# Patient Record
Sex: Female | Born: 1995
Health system: Southern US, Community
[De-identification: ages and names within clinical notes are randomized; demographics above are authoritative.]

## PROBLEM LIST (undated history)

## (undated) DIAGNOSIS — O139 Gestational [pregnancy-induced] hypertension without significant proteinuria, unspecified trimester: Secondary | ICD-10-CM

## (undated) HISTORY — DX: Gestational (pregnancy-induced) hypertension without significant proteinuria, unspecified trimester: O13.9

## (undated) HISTORY — PX: NO PAST SURGERIES: SHX2092

---

## 2017-05-18 DIAGNOSIS — O24419 Gestational diabetes mellitus in pregnancy, unspecified control: Secondary | ICD-10-CM

## 2017-05-18 HISTORY — DX: Gestational diabetes mellitus in pregnancy, unspecified control: O24.419

## 2017-12-02 DIAGNOSIS — O24419 Gestational diabetes mellitus in pregnancy, unspecified control: Secondary | ICD-10-CM

## 2019-02-21 ENCOUNTER — Emergency Department (HOSPITAL_COMMUNITY)
Admission: EM | Admit: 2019-02-21 | Discharge: 2019-02-21 | Disposition: A | Discharge status: Home / Self Care | Payer: Self-pay | Attending: Emergency Medicine | Admitting: Emergency Medicine

## 2019-02-21 ENCOUNTER — Other Ambulatory Visit: Payer: Self-pay

## 2019-02-21 ENCOUNTER — Encounter (HOSPITAL_COMMUNITY): Payer: Self-pay | Admitting: Emergency Medicine

## 2019-02-21 ENCOUNTER — Emergency Department (HOSPITAL_COMMUNITY): Payer: Self-pay

## 2019-02-21 DIAGNOSIS — Y9289 Other specified places as the place of occurrence of the external cause: Secondary | ICD-10-CM | POA: Insufficient documentation

## 2019-02-21 DIAGNOSIS — X501XXA Overexertion from prolonged static or awkward postures, initial encounter: Secondary | ICD-10-CM | POA: Insufficient documentation

## 2019-02-21 DIAGNOSIS — S93492A Sprain of other ligament of left ankle, initial encounter: Secondary | ICD-10-CM | POA: Insufficient documentation

## 2019-02-21 DIAGNOSIS — S93402A Sprain of unspecified ligament of left ankle, initial encounter: Secondary | ICD-10-CM

## 2019-02-21 DIAGNOSIS — Y9301 Activity, walking, marching and hiking: Secondary | ICD-10-CM | POA: Insufficient documentation

## 2019-02-21 DIAGNOSIS — Y999 Unspecified external cause status: Secondary | ICD-10-CM | POA: Insufficient documentation

## 2019-02-21 MED ORDER — IBUPROFEN 400 MG PO TABS
600.0000 mg | ORAL_TABLET | Freq: Once | ORAL | Status: AC
  Administered 2019-02-21: 600 mg via ORAL
  Filled 2019-02-21: qty 2

## 2019-02-21 NOTE — Discharge Instructions (Signed)
Your x-ray shows no evidence of fracture, you have an ankle sprain, wear ankle brace and use crutches, you can bear weight as tolerated as pain improves.  Ice and elevate the ankle.  Use 600 mg ibuprofen for pain, Tylenol as needed for additional pain.  If symptoms are not improving over the next week please follow-up with your PCP.

## 2019-02-21 NOTE — ED Triage Notes (Signed)
PT states she was walking outside and twisted her left ankle and fell onto her right side. PT c/o left ankle pain with ROM.

## 2019-02-21 NOTE — ED Provider Notes (Signed)
[facility] EMERGENCY DEPARTMENT Provider Note   CSN: [id number] Arrival date & time: 02/21/19  2041     History   Chief Complaint Chief Complaint  Patient presents with  . Ankle Injury    HPI Suzanne Mack is a 23 y.o. female.     Suzanne Mack is a 23 y.o. female who is otherwise healthy, presents to the ED for evaluation of left ankle pain.  She reports she was walking outside and twisted the left ankle falling onto her right side, no injuries from the fall.  Patient reports since then she has had pain over the top of the left foot without much swelling.  She has been ambulatory although pain is increased throughout the day while she has been walking on it.  No numbness tingling or weakness.  No bruising or discoloration.  Reports history of a similar ankle injury several years ago, but never required surgery.  No previous fractures.  Has not taken anything for pain prior to arrival, no other aggravating or alleviating factors.     History reviewed. No pertinent past medical history.  There are no active problems to display for this patient.   History reviewed. No pertinent surgical history.   OB History    Gravida  1   Para      Term      Preterm      AB      Living  1     SAB      TAB      Ectopic      Multiple      Live Births               Home Medications    Prior to Admission medications   Not on File    Family History History reviewed. No pertinent family history.  Social History Social History   Tobacco Use  . Smoking status: Never Smoker  . Smokeless tobacco: Never Used  Substance Use Topics  . Alcohol use: Never    Frequency: Never  . Drug use: Never     Allergies   Patient has no known allergies.   Review of Systems Review of Systems  Constitutional: Negative for chills and fever.  Musculoskeletal: Positive for arthralgias. Negative for joint swelling.  Skin: Negative for color change and wound.   Neurological: Negative for weakness and numbness.     Physical Exam Updated Vital Signs BP 138/89 (BP Location: Right Arm)   Pulse 100   Temp 98.7 F (37.1 C) (Oral)   Resp 18   Ht 5\' 3"  (1.6 m)   Wt 74.8 kg   LMP 01/22/2019   SpO2 97%   BMI 29.23 kg/m   Physical Exam Vitals signs and nursing note reviewed.  Constitutional:      General: She is not in acute distress.    Appearance: Normal appearance. She is well-developed and normal weight. She is not ill-appearing or diaphoretic.  HENT:     Head: Normocephalic and atraumatic.  Eyes:     General:        Right eye: No discharge.        Left eye: No discharge.  Pulmonary:     Effort: Pulmonary effort is normal. No respiratory distress.  Musculoskeletal:     Comments: Mild tenderness over the top of the left foot going towards the medial malleolus without appreciable swelling, no tenderness over the lateral malleolus, no tenderness over the calcaneus or Achilles tendon.  No ecchymosis  or color change.  2+ DP and PT pulses and good cap refill, normal sensation and strength.  No pain over the distal forefoot no pain at the knee.  Skin:    General: Skin is warm and dry.     Capillary Refill: Capillary refill takes less than 2 seconds.  Neurological:     Mental Status: She is alert and oriented to person, place, and time.     Coordination: Coordination normal.  Psychiatric:        Mood and Affect: Mood normal.        Behavior: Behavior normal.      ED Treatments / Results  Labs (all labs ordered are listed, but only abnormal results are displayed) Labs Reviewed - No data to display  EKG None  Radiology Dg Ankle Complete Left  Result Date: 02/21/2019 CLINICAL DATA:  Twisted left ankle with pain. EXAM: LEFT ANKLE COMPLETE - 3+ VIEW COMPARISON:  None. FINDINGS: There is no evidence of fracture, dislocation, or joint effusion. There is no evidence of arthropathy or other focal bone abnormality. Soft tissues are  unremarkable. IMPRESSION: Negative. Electronically Signed   By: Abelardo Diesel M.D.   On: 02/21/2019 21:38    Procedures Procedures (including critical care time)  Medications Ordered in ED Medications  ibuprofen (ADVIL) tablet 600 mg (has no administration in time range)     Initial Impression / Assessment and Plan / ED Course  I have reviewed the triage vital signs and the nursing notes.  Pertinent labs & imaging results that were available during my care of the patient were reviewed by me and considered in my medical decision making (see chart for details).  Presentation consistent with ankle sprain. Tenderness and swelling over top of the foot at the base of the ankle, no significant swelling, no tenderness over Achilles, pt is neurovascularly intact, and x-ray negative for fracture, and shows ankle mortise is intact. Pain treated in the ED. Pt placed in ASO brace and provided crutches, ambulated without difficulty. Pt stable for discharge home with ibuprofen for pain. Pt to follow-up with ortho in one week if symptoms not improving. Return precautions discussed, Pt expresses understanding and agrees with plan.   Final Clinical Impressions(s) / ED Diagnoses   Final diagnoses:  Sprain of left ankle, unspecified ligament, initial encounter    ED Discharge Orders    None       Janet Berlin 02/21/19 2219    Noemi Chapel, MD 02/24/19 [phone]

## 2019-05-30 ENCOUNTER — Other Ambulatory Visit: Payer: Self-pay

## 2019-05-30 ENCOUNTER — Ambulatory Visit: Payer: HRSA Program | Attending: Internal Medicine

## 2019-05-30 DIAGNOSIS — Z20822 Contact with and (suspected) exposure to covid-19: Secondary | ICD-10-CM

## 2019-06-01 ENCOUNTER — Telehealth: Payer: Self-pay | Admitting: *Deleted

## 2019-06-01 LAB — NOVEL CORONAVIRUS, NAA: SARS-CoV-2, NAA: NOT DETECTED

## 2019-06-01 NOTE — Telephone Encounter (Signed)
Patient calling for COVID result- notified still pending. Discussed MyChart- setup and activation code and help desk number given.

## 2021-04-17 ENCOUNTER — Encounter: Payer: Self-pay | Admitting: Obstetrics & Gynecology

## 2021-05-09 IMAGING — DX DG ANKLE COMPLETE 3+V*L*
3 series · 4 of 4 positions shown · non-contrast
Comparison: None.

CLINICAL DATA: Twisted left ankle with pain.

EXAM:
LEFT ANKLE COMPLETE - 3+ VIEW

[ankle ap]
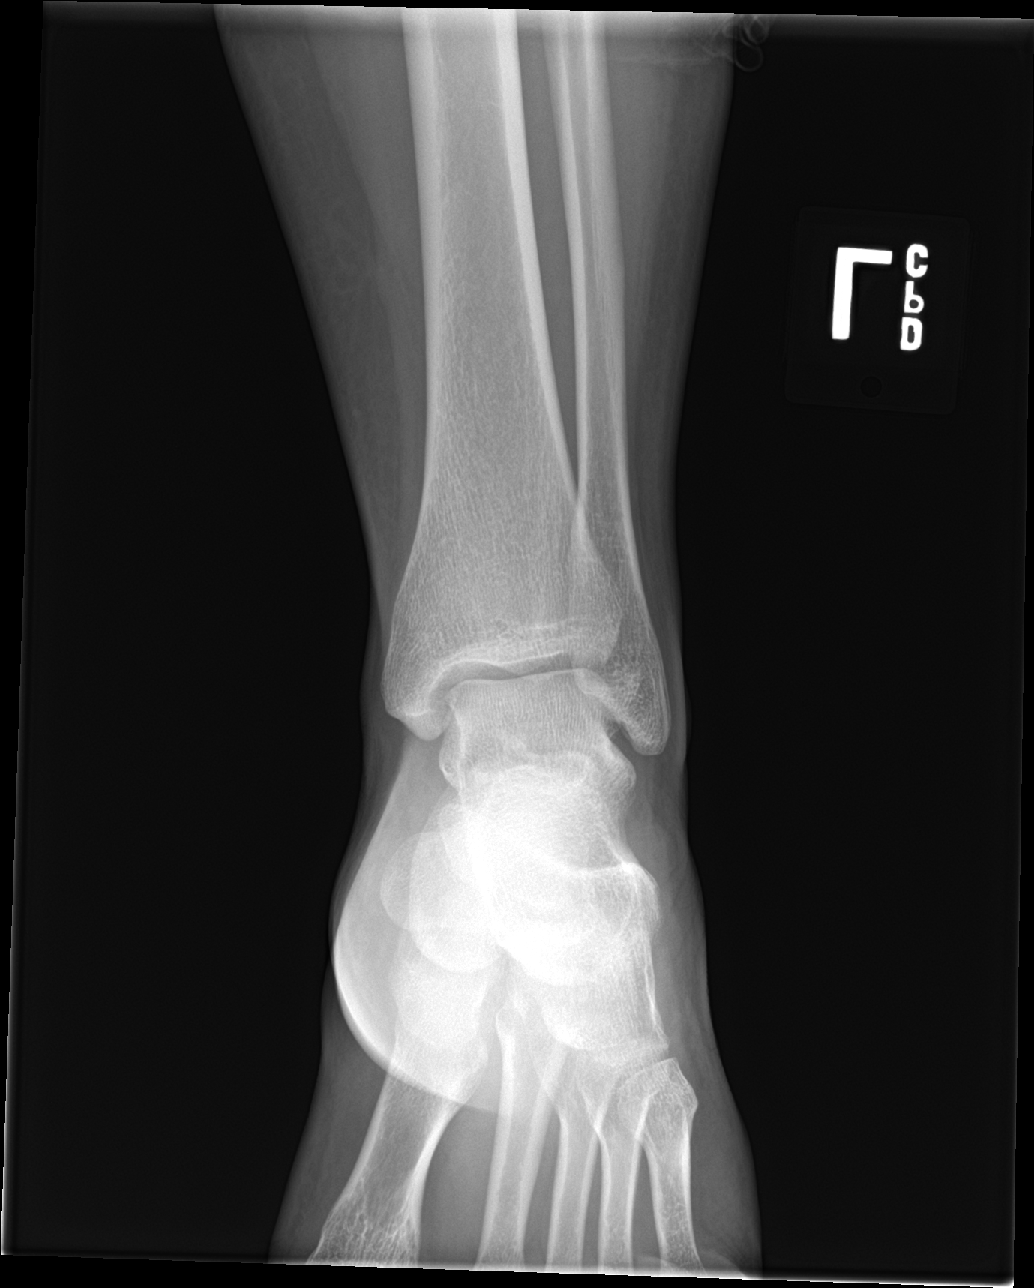

[Series 2: ankle obl · 0.14mm/px · 2 of 2 slices shown]
[im 1/2]
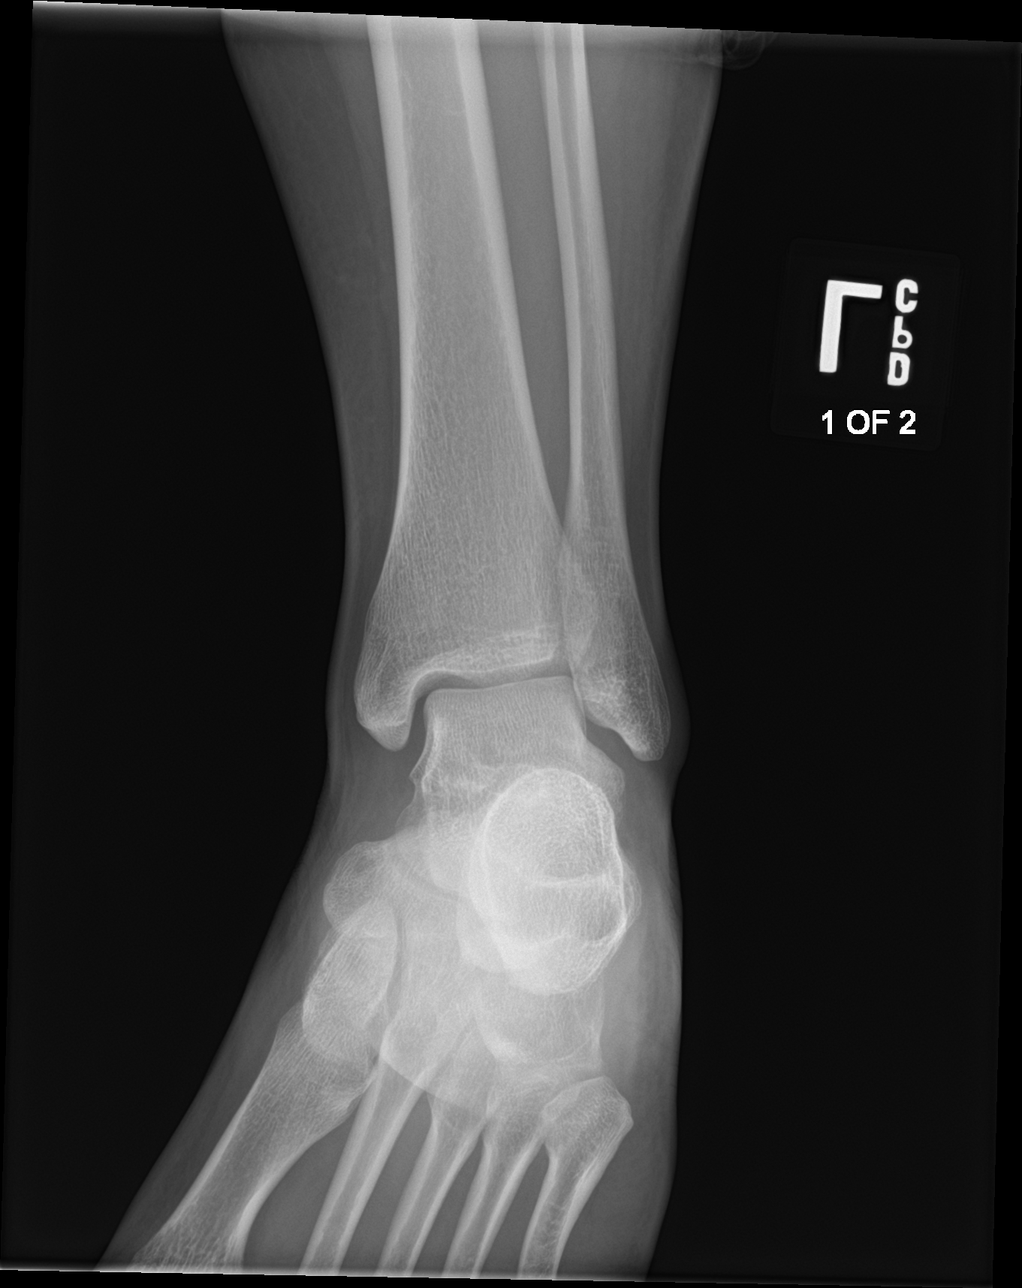
[im 2/2]
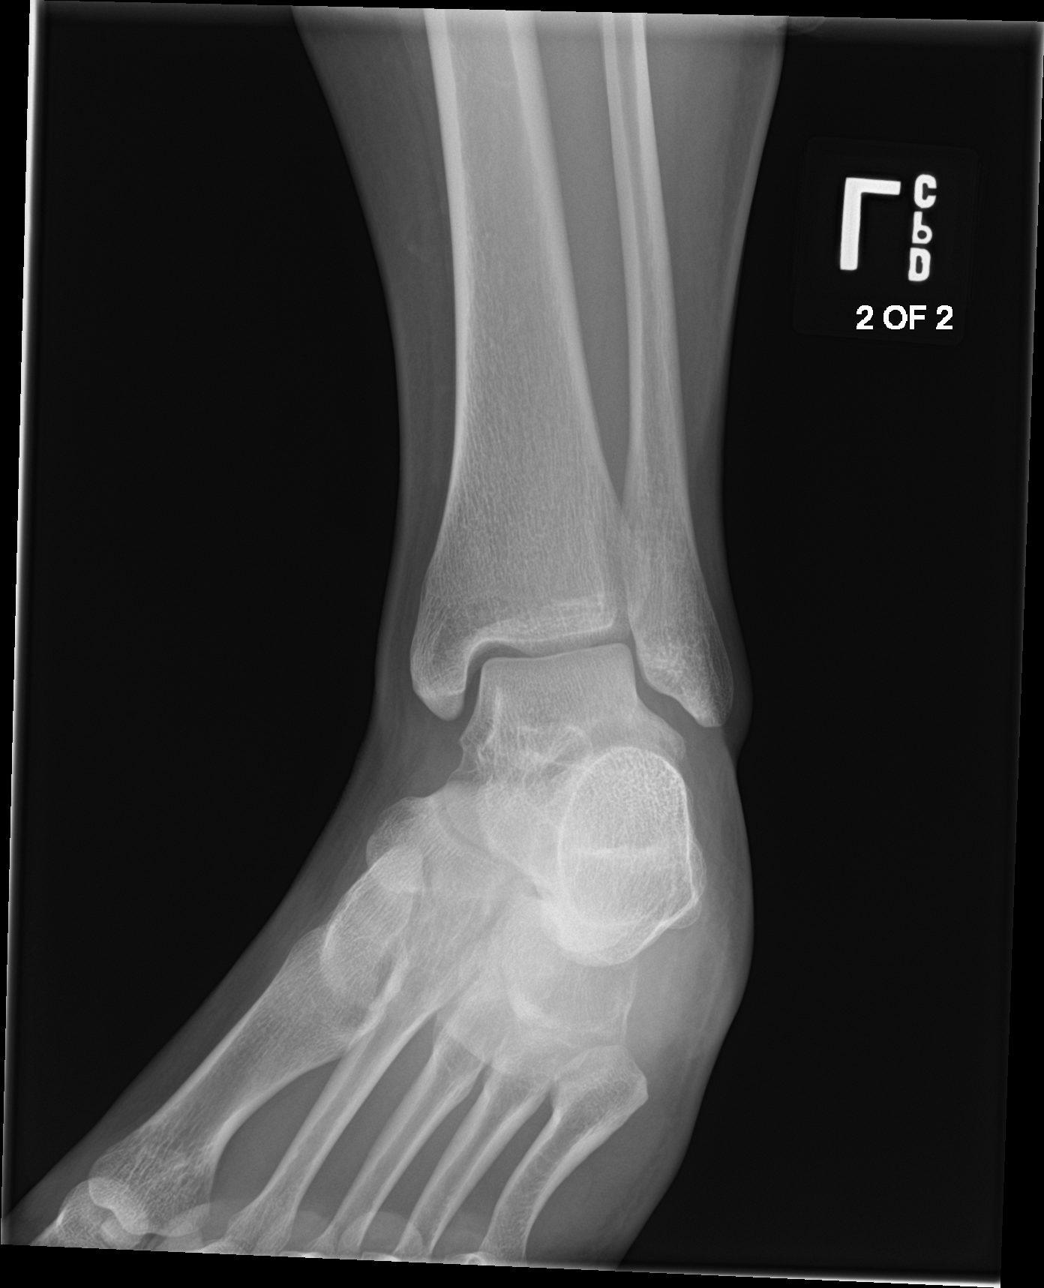

[ankle lat]
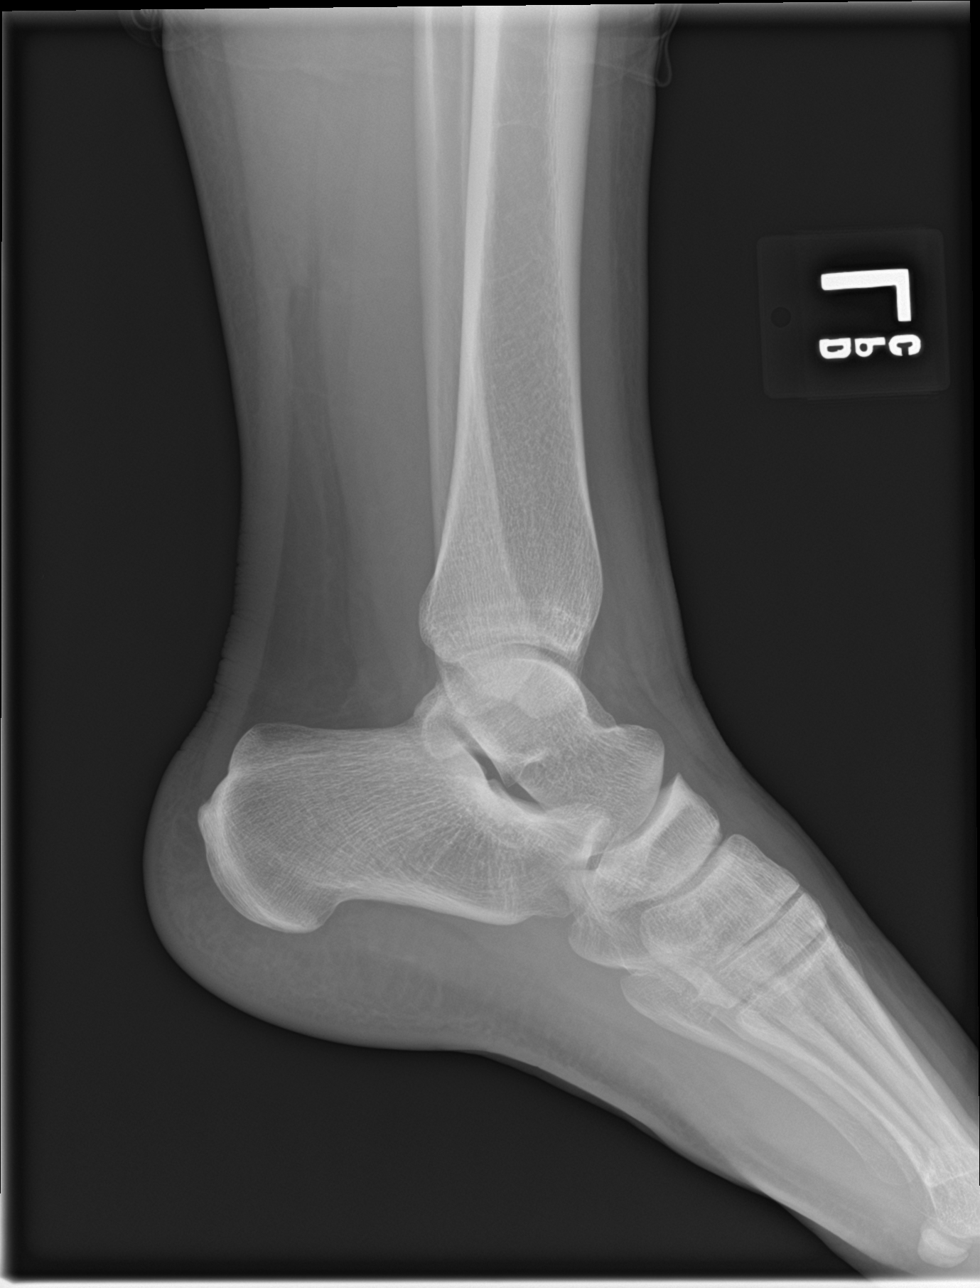

[4 of 4 positions shown; findings below may reference images not displayed]

FINDINGS: There is no evidence of fracture, dislocation, or joint effusion.
There is no evidence of arthropathy or other focal bone abnormality.
Soft tissues are unremarkable.
IMPRESSION: Negative.

## 2021-05-15 ENCOUNTER — Other Ambulatory Visit: Payer: Self-pay

## 2021-05-15 ENCOUNTER — Other Ambulatory Visit (HOSPITAL_COMMUNITY)
Admission: RE | Admit: 2021-05-15 | Discharge: 2021-05-15 | Disposition: A | Discharge status: Home / Self Care | Payer: Medicaid Other | Source: Ambulatory Visit | Attending: Obstetrics & Gynecology | Admitting: Obstetrics & Gynecology

## 2021-05-15 ENCOUNTER — Encounter: Payer: Self-pay | Admitting: Obstetrics & Gynecology

## 2021-05-15 ENCOUNTER — Ambulatory Visit (INDEPENDENT_AMBULATORY_CARE_PROVIDER_SITE_OTHER): Payer: Medicaid Other | Admitting: Obstetrics & Gynecology

## 2021-05-15 VITALS — BP 153/91 | HR 89 | Ht 63.0 in | Wt 171.6 lb

## 2021-05-15 DIAGNOSIS — Z01419 Encounter for gynecological examination (general) (routine) without abnormal findings: Secondary | ICD-10-CM | POA: Insufficient documentation

## 2021-05-15 DIAGNOSIS — R030 Elevated blood-pressure reading, without diagnosis of hypertension: Secondary | ICD-10-CM

## 2021-05-15 DIAGNOSIS — Z3009 Encounter for other general counseling and advice on contraception: Secondary | ICD-10-CM

## 2021-05-15 NOTE — Progress Notes (Signed)
WELL-WOMAN EXAMINATION Patient name: Suzanne Mack MRN [id number]  Date of birth: 07-25-1995 Chief Complaint:   New Patient (Initial Visit)  History of Present Illness:   Suzanne Mack is a 25 y.o. G64P1001  female being seen today for a routine well-woman exam.   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 Different partner then her prior pregnancy- current partner has 3 children of his own.  States they have sex at least daily.  -She is concerned that the infertility is because of her and states her periods are "all over the place." Not using an app, just tracking on her period.  She denies problems getting pregnant with her first pregnancy.  Menses are typically 7 days and start on the following days: Dec. 1-5 Nov. 1-8, Oct. 4-11 -Sept 9- -Aug 13 -July 15 -June 18  She denies h/o STI or PID.  Denies other medical problems.  Denies pelvic or abdominal pain.  No other acute concerns.  Patient's last menstrual period was 04/17/2021 (exact date).  The current method of family planning is none.    Last pap 2019?Marland Kitchen  Last mammogram: n/a. Last colonoscopy: n/a  Depression screen [facility] 2/9 05/15/2021  Decreased Interest 1  Down, Depressed, Hopeless 1  PHQ - 2 Score 2  Altered sleeping 0  Tired, decreased energy 1  Change in appetite 0  Feeling bad or failure about yourself  1  Trouble concentrating 0  Moving slowly or fidgety/restless 0  Suicidal thoughts 0  PHQ-9 Score 4    Review of Systems:   Pertinent items are noted in HPI Denies any headaches, blurred vision, fatigue, shortness of breath, chest pain, abdominal pain, bowel movements, urination, or intercourse unless otherwise stated above.  Pertinent History Reviewed:  Reviewed past medical,surgical, social and family history.  Reviewed problem list, medications and allergies. Physical Assessment:   Vitals:   05/15/21 1336 05/15/21 1338  BP: (!) 151/92 (!) 153/91  Pulse: 89 89  Weight:  171 lb 9.6 oz (77.8 kg)   Height: 5\' 3"  (1.6 m)   Body mass index is 30.4 kg/m.        Physical Examination:   General appearance - well appearing, and in no distress  Mental status - alert, oriented to person, place, and time  Psych:  She has a normal mood and affect  Skin - warm and dry, normal color, no suspicious lesions noted  Chest - effort normal, all lung fields clear to auscultation bilaterally  Heart - normal rate and regular rhythm  Neck:  midline trachea, no thyromegaly or nodules  Breasts - breasts appear normal, no suspicious masses, no skin or nipple changes or  axillary nodes  Abdomen - soft, nontender, nondistended, no masses or organomegaly  Pelvic - VULVA: normal appearing vulva with no masses, tenderness or lesions  VAGINA: normal appearing vagina with normal color and discharge, no lesions  CERVIX: normal appearing cervix without discharge or lesions, no CMT  Thin prep pap is done with HR HPV cotesting  UTERUS: uterus is felt to be normal size, shape, consistency and nontender   ADNEXA: No adnexal masses or tenderness noted.  Extremities:  No swelling or varicosities noted  Chaperone: & Plan:  1) Well-Woman Exam -pap collected reviewed screening guidelines  2) Family planning -reviewed "normal" cycle which can be every 21-36 days -encouraged regular tracking of cycle on ovulation/period tracker -discussed components of infertility work up- with current insurance- not likely to  be covered -declined work up at this time due to financial restraints  3) Elevated Blood pressure -pt plans to check BP at home -concern for white coat syndrome -f/u in 1 mos for BP check with RN   Meds: No orders of the defined types were placed in this encounter.   Follow-up: Return in about 4 weeks (around 06/12/2021) for RN visit- BP check.   Myna Hidalgo, DO Attending Obstetrician & Gynecologist, [facility] for [employer],  [facility]

## 2021-05-18 DIAGNOSIS — Z419 Encounter for procedure for purposes other than remedying health state, unspecified: Secondary | ICD-10-CM | POA: Diagnosis not present

## 2021-05-18 NOTE — L&D Delivery Note (Addendum)
Delivery Note  Obstetrician: Wyn Forster PGY3  Attending: Thressa Sheller CNM  Pre-Delivery Diagnosis: Term pregnancy or Induced labor  Post-Delivery Diagnosis: Living newborn infant female  Procedure: Induced vaginal delivery for GDMA1, s/p Cytotec(1x vaginal, 2x oral), SROM at delivery. Tight nuchal cord delivered through without incident. No lacerations. 1 minute of delayed cord clamping performed. Cord cut by Mom. Post partum pitocin infusion administered. Fundus firm.   Anesthesia: None   Episiotomy or Incision: none  Indications for instrumental delivery: none  Infant Wt: not yet weighed        Apgars: 1' 8  /  5' 9   Placenta and Cord:         Mechanism: spontaneous       Description:  complete  Estimated Blood Loss:  200 mL         Total IV Fluids: 2L LR         Specimens: cord blood collected, placenta to L&D for discard.         Complications:  none         Condition: stable  Blood Type and Rh: --/--/B POS (08/25 0110)  Rubella Immunity Status: Immune     Infant Feeding:  breast feeding  Attending Attestation: I was present with the resident for the entire delivery and I agree with the note above.   Thressa Sheller DNP, CNM  01/09/22  1:05 PM

## 2021-05-21 LAB — CYTOLOGY - PAP
Chlamydia: NEGATIVE
Comment: NEGATIVE
Comment: NEGATIVE
Comment: NORMAL
Diagnosis: NEGATIVE
High risk HPV: NEGATIVE
Neisseria Gonorrhea: NEGATIVE

## 2021-05-22 ENCOUNTER — Other Ambulatory Visit: Payer: Self-pay

## 2021-05-22 ENCOUNTER — Encounter: Payer: Self-pay | Admitting: Obstetrics & Gynecology

## 2021-05-22 ENCOUNTER — Ambulatory Visit (INDEPENDENT_AMBULATORY_CARE_PROVIDER_SITE_OTHER): Payer: Self-pay | Admitting: *Deleted

## 2021-05-22 DIAGNOSIS — Z3201 Encounter for pregnancy test, result positive: Secondary | ICD-10-CM

## 2021-05-22 LAB — POCT URINE PREGNANCY: Preg Test, Ur: POSITIVE — AB

## 2021-05-22 NOTE — Progress Notes (Signed)
Chart reviewed for nurse visit. Agree with plan of care.  Adline Potter, NP 05/22/2021 2:01 PM

## 2021-05-22 NOTE — Progress Notes (Signed)
° °  NURSE VISIT- PREGNANCY CONFIRMATION   SUBJECTIVE:  Suzanne Mack is a 26 y.o. G56P1001 female at [redacted]w[redacted]d by certain LMP of Patient's last menstrual period was 04/17/2021 (exact date). Here for pregnancy confirmation.  Home pregnancy test: positive x 3   She reports no complaints.  She is taking prenatal vitamins.    OBJECTIVE:  LMP 04/17/2021 (Exact Date)   Appears well, in no apparent distress  Results for orders placed or performed in visit on 05/22/21 (from the past 24 hour(s))  POCT urine pregnancy   Collection Time: 05/22/21  1:32 PM  Result Value Ref Range   Preg Test, Ur Positive (A) Negative    ASSESSMENT: Positive pregnancy test, [redacted]w[redacted]d by LMP    PLAN: Schedule for dating ultrasound in 3 weeks Prenatal vitamins: continue   Nausea medicines: not currently needed   OB packet given: Yes  Annamarie Dawley  05/22/2021 1:34 PM

## 2021-06-11 ENCOUNTER — Other Ambulatory Visit: Payer: Self-pay | Admitting: Obstetrics & Gynecology

## 2021-06-11 DIAGNOSIS — O3680X0 Pregnancy with inconclusive fetal viability, not applicable or unspecified: Secondary | ICD-10-CM

## 2021-06-12 ENCOUNTER — Ambulatory Visit (INDEPENDENT_AMBULATORY_CARE_PROVIDER_SITE_OTHER): Payer: Medicaid Other | Admitting: *Deleted

## 2021-06-12 ENCOUNTER — Ambulatory Visit (INDEPENDENT_AMBULATORY_CARE_PROVIDER_SITE_OTHER): Payer: Medicaid Other

## 2021-06-12 ENCOUNTER — Other Ambulatory Visit: Payer: Self-pay

## 2021-06-12 VITALS — BP 133/86 | HR 83

## 2021-06-12 DIAGNOSIS — O3680X0 Pregnancy with inconclusive fetal viability, not applicable or unspecified: Secondary | ICD-10-CM | POA: Diagnosis not present

## 2021-06-12 DIAGNOSIS — Z3A08 8 weeks gestation of pregnancy: Secondary | ICD-10-CM

## 2021-06-12 DIAGNOSIS — Z0130 Encounter for examination of blood pressure without abnormal findings: Secondary | ICD-10-CM

## 2021-06-12 NOTE — Progress Notes (Signed)
[place] 8 wks,single IUP with YS,FHR 150 bpm,small subchorionic hemorrhage 2.1 x .9 x .7 cm,CRL 16.18 mm,normal ovaries

## 2021-06-12 NOTE — Progress Notes (Signed)
° °  NURSE VISIT- BLOOD PRESSURE CHECK  SUBJECTIVE:  Suzanne Mack is a 26 y.o. G58P1001 female here for BP check. She is [redacted]w[redacted]d pregnant    HYPERTENSION ROS:  Pregnant/postpartum:  Severe headaches that don't go away with tylenol/other medicines: No  Visual changes (seeing spots/double/blurred vision) No  Severe pain under right breast breast or in center of upper chest No  Severe nausea/vomiting No  Taking medicines as instructed not applicable    OBJECTIVE:  BP 133/86 (BP Location: Right Arm, Patient Position: Sitting, Cuff Size: Normal)    Pulse 83    LMP 04/17/2021 (Exact Date)   Appearance alert, well appearing, and in no distress.  ASSESSMENT: Pregnancy [redacted]w[redacted]d  blood pressure check  PLAN: Routed to  Dr. Nelda Marseille   Recommendations: no changes needed   Follow-up: as scheduled   Suzanne Mack  06/12/2021 3:17 PM

## 2021-06-18 DIAGNOSIS — Z419 Encounter for procedure for purposes other than remedying health state, unspecified: Secondary | ICD-10-CM | POA: Diagnosis not present

## 2021-07-11 ENCOUNTER — Other Ambulatory Visit: Payer: Self-pay | Admitting: Obstetrics & Gynecology

## 2021-07-11 DIAGNOSIS — Z3682 Encounter for antenatal screening for nuchal translucency: Secondary | ICD-10-CM

## 2021-07-14 ENCOUNTER — Ambulatory Visit: Payer: Medicaid Other | Admitting: *Deleted

## 2021-07-14 ENCOUNTER — Encounter: Payer: Self-pay | Admitting: Women's Health

## 2021-07-14 ENCOUNTER — Ambulatory Visit (INDEPENDENT_AMBULATORY_CARE_PROVIDER_SITE_OTHER): Payer: Medicaid Other | Admitting: Women's Health

## 2021-07-14 ENCOUNTER — Other Ambulatory Visit: Payer: Self-pay

## 2021-07-14 ENCOUNTER — Ambulatory Visit (INDEPENDENT_AMBULATORY_CARE_PROVIDER_SITE_OTHER): Payer: Medicaid Other

## 2021-07-14 VITALS — BP 124/81 | HR 101 | Wt 172.2 lb

## 2021-07-14 DIAGNOSIS — Z3480 Encounter for supervision of other normal pregnancy, unspecified trimester: Secondary | ICD-10-CM

## 2021-07-14 DIAGNOSIS — Z3A12 12 weeks gestation of pregnancy: Secondary | ICD-10-CM

## 2021-07-14 DIAGNOSIS — Z8759 Personal history of other complications of pregnancy, childbirth and the puerperium: Secondary | ICD-10-CM

## 2021-07-14 DIAGNOSIS — Z8659 Personal history of other mental and behavioral disorders: Secondary | ICD-10-CM | POA: Diagnosis not present

## 2021-07-14 DIAGNOSIS — Z3490 Encounter for supervision of normal pregnancy, unspecified, unspecified trimester: Secondary | ICD-10-CM | POA: Insufficient documentation

## 2021-07-14 DIAGNOSIS — O0990 Supervision of high risk pregnancy, unspecified, unspecified trimester: Secondary | ICD-10-CM | POA: Insufficient documentation

## 2021-07-14 DIAGNOSIS — Z3481 Encounter for supervision of other normal pregnancy, first trimester: Secondary | ICD-10-CM

## 2021-07-14 DIAGNOSIS — Z3682 Encounter for antenatal screening for nuchal translucency: Secondary | ICD-10-CM

## 2021-07-14 DIAGNOSIS — Z8632 Personal history of gestational diabetes: Secondary | ICD-10-CM | POA: Diagnosis not present

## 2021-07-14 DIAGNOSIS — Z1379 Encounter for other screening for genetic and chromosomal anomalies: Secondary | ICD-10-CM | POA: Diagnosis not present

## 2021-07-14 MED ORDER — BLOOD PRESSURE MONITOR MISC: 0 refills | Status: DC

## 2021-07-14 MED ORDER — DOXYLAMINE-PYRIDOXINE 10-10 MG PO TBEC: DELAYED_RELEASE_TABLET | ORAL | 6 refills | Status: DC

## 2021-07-14 NOTE — Patient Instructions (Signed)
Lourie, thank you for choosing our office today! We appreciate the opportunity to meet your healthcare needs. You may receive a short survey by mail, e-mail, or through MyChart. If you are happy with your care we would appreciate if you could take just a few minutes to complete the survey questions. We read all of your comments and take your feedback very seriously. Thank you again for choosing our office.  [facility] ([address] [place], [place] [address]) Entrance C, located off of [address] Free 24/7 valet parking   Nausea & Vomiting Have saltine crackers or pretzels by your bed and eat a few bites before you raise your head out of bed in the morning Eat small frequent meals throughout the day instead of large meals Drink plenty of fluids throughout the day to stay hydrated, just don't drink a lot of fluids with your meals.  This can make your stomach fill up faster making you feel sick Do not brush your teeth right after you eat Products with real ginger are good for nausea, like ginger ale and ginger hard candy Make sure it says made with real ginger! Sucking on sour candy like lemon heads is also good for nausea If your prenatal vitamins make you nauseated, take them at night so you will sleep through the nausea Sea Bands If you feel like you need medicine for the nausea & vomiting please let us know If you are unable to keep any fluids or food down please let us know   Constipation Drink plenty of fluid, preferably water, throughout the day Eat foods high in fiber such as fruits, vegetables, and grains Exercise, such as walking, is a good way to keep your bowels regular Drink warm fluids, especially warm prune juice, or decaf coffee Eat a 1/2 cup of real oatmeal (not instant), 1/2 cup applesauce, and 1/2-1 cup warm prune juice every day If needed, you may take Colace (docusate sodium) stool  softener once or twice a day to help keep the stool soft.  If you still are having problems with constipation, you may take Miralax once daily as needed to help keep your bowels regular.   Home Blood Pressure Monitoring for Patients   Your provider has recommended that you check your blood pressure (BP) at least once a week at home. If you do not have a blood pressure cuff at home, one will be provided for you. Contact your provider if you have not received your monitor within 1 week.   Helpful Tips for Accurate Home Blood Pressure Checks  Don't smoke, exercise, or drink caffeine 30 minutes before checking your BP Use the restroom before checking your BP (a full bladder can raise your pressure) Relax in a comfortable upright chair Feet on the ground Left arm resting comfortably on a flat surface at the level of your heart Legs uncrossed Back supported Sit quietly and don't talk Place the cuff on your bare arm Adjust snuggly, so that only two fingertips can fit between your skin and the top of the cuff Check 2 readings separated by at least one minute Keep a log of your BP readings For a visual, please reference this diagram: http://ccnc.care/bpdiagram  Provider Name: [facility]     Phone: [phone]  Zone 1: ALL CLEAR  Continue to monitor your symptoms:  BP reading is less than 140 (top number) or less than 90 (bottom   number)  No right upper stomach pain No headaches or seeing spots No feeling nauseated or throwing up No swelling in face and hands  Zone 2: CAUTION Call your doctor's office for any of the following:  BP reading is greater than 140 (top number) or greater than 90 (bottom number)  Stomach pain under your ribs in the middle or right side Headaches or seeing spots Feeling nauseated or throwing up Swelling in face and hands  Zone 3: EMERGENCY  Seek immediate medical care if you have any of the following:  BP reading is greater than160 (top number) or  greater than 110 (bottom number) Severe headaches not improving with Tylenol Serious difficulty catching your breath Any worsening symptoms from Zone 2    First Trimester of Pregnancy The first trimester of pregnancy is from week 1 until the end of week 12 (months 1 through 3). A week after a sperm fertilizes an egg, the egg will implant on the wall of the uterus. This embryo will begin to develop into a baby. Genes from you and your partner are forming the baby. The female genes determine whether the baby is a boy or a girl. At 6-8 weeks, the eyes and face are formed, and the heartbeat can be seen on ultrasound. At the end of 12 weeks, all the baby's organs are formed.  Now that you are pregnant, you will want to do everything you can to have a healthy baby. Two of the most important things are to get good prenatal care and to follow your health care provider's instructions. Prenatal care is all the medical care you receive before the baby's birth. This care will help prevent, find, and treat any problems during the pregnancy and childbirth. BODY CHANGES Your body goes through many changes during pregnancy. The changes vary from woman to woman.  You may gain or lose a couple of pounds at first. You may feel sick to your stomach (nauseous) and throw up (vomit). If the vomiting is uncontrollable, call your health care provider. You may tire easily. You may develop headaches that can be relieved by medicines approved by your health care provider. You may urinate more often. Painful urination may mean you have a bladder infection. You may develop heartburn as a result of your pregnancy. You may develop constipation because certain hormones are causing the muscles that push waste through your intestines to slow down. You may develop hemorrhoids or swollen, bulging veins (varicose veins). Your breasts may begin to grow larger and become tender. Your nipples may stick out more, and the tissue that  surrounds them (areola) may become darker. Your gums may bleed and may be sensitive to brushing and flossing. Dark spots or blotches (chloasma, mask of pregnancy) may develop on your face. This will likely fade after the baby is born. Your menstrual periods will stop. You may have a loss of appetite. You may develop cravings for certain kinds of food. You may have changes in your emotions from day to day, such as being excited to be pregnant or being concerned that something may go wrong with the pregnancy and baby. You may have more vivid and strange dreams. You may have changes in your hair. These can include thickening of your hair, rapid growth, and changes in texture. Some women also have hair loss during or after pregnancy, or hair that feels dry or thin. Your hair will most likely return to normal after your baby is born. WHAT TO EXPECT AT YOUR PRENATAL   VISITS During a routine prenatal visit: You will be weighed to make sure you and the baby are growing normally. Your blood pressure will be taken. Your abdomen will be measured to track your baby's growth. The fetal heartbeat will be listened to starting around week 10 or 12 of your pregnancy. Test results from any previous visits will be discussed. Your health care provider may ask you: How you are feeling. If you are feeling the baby move. If you have had any abnormal symptoms, such as leaking fluid, bleeding, severe headaches, or abdominal cramping. If you have any questions. Other tests that may be performed during your first trimester include: Blood tests to find your blood type and to check for the presence of any previous infections. They will also be used to check for low iron levels (anemia) and Rh antibodies. Later in the pregnancy, blood tests for diabetes will be done along with other tests if problems develop. Urine tests to check for infections, diabetes, or protein in the urine. An ultrasound to confirm the proper growth  and development of the baby. An amniocentesis to check for possible genetic problems. Fetal screens for spina bifida and Down syndrome. You may need other tests to make sure you and the baby are doing well. HOME CARE INSTRUCTIONS  Medicines Follow your health care provider's instructions regarding medicine use. Specific medicines may be either safe or unsafe to take during pregnancy. Take your prenatal vitamins as directed. If you develop constipation, try taking a stool softener if your health care provider approves. Diet Eat regular, well-balanced meals. Choose a variety of foods, such as meat or vegetable-based protein, fish, milk and low-fat dairy products, vegetables, fruits, and whole grain breads and cereals. Your health care provider will help you determine the amount of weight gain that is right for you. Avoid raw meat and uncooked cheese. These carry germs that can cause birth defects in the baby. Eating four or five small meals rather than three large meals a day may help relieve nausea and vomiting. If you start to feel nauseous, eating a few soda crackers can be helpful. Drinking liquids between meals instead of during meals also seems to help nausea and vomiting. If you develop constipation, eat more high-fiber foods, such as fresh vegetables or fruit and whole grains. Drink enough fluids to keep your urine clear or pale yellow. Activity and Exercise Exercise only as directed by your health care provider. Exercising will help you: Control your weight. Stay in shape. Be prepared for labor and delivery. Experiencing pain or cramping in the lower abdomen or low back is a good sign that you should stop exercising. Check with your health care provider before continuing normal exercises. Try to avoid standing for long periods of time. Move your legs often if you must stand in one place for a long time. Avoid heavy lifting. Wear low-heeled shoes, and practice good posture. You may  continue to have sex unless your health care provider directs you otherwise. Relief of Pain or Discomfort Wear a good support bra for breast tenderness.   Take warm sitz baths to soothe any pain or discomfort caused by hemorrhoids. Use hemorrhoid cream if your health care provider approves.   Rest with your legs elevated if you have leg cramps or low back pain. If you develop varicose veins in your legs, wear support hose. Elevate your feet for 15 minutes, 3-4 times a day. Limit salt in your diet. Prenatal Care Schedule your prenatal visits by the   twelfth week of pregnancy. They are usually scheduled monthly at first, then more often in the last 2 months before delivery. Write down your questions. Take them to your prenatal visits. Keep all your prenatal visits as directed by your health care provider. Safety Wear your seat belt at all times when driving. Make a list of emergency phone numbers, including numbers for family, friends, the hospital, and police and fire departments. General Tips Ask your health care provider for a referral to a local prenatal education class. Begin classes no later than at the beginning of month 6 of your pregnancy. Ask for help if you have counseling or nutritional needs during pregnancy. Your health care provider can offer advice or refer you to specialists for help with various needs. Do not use hot tubs, steam rooms, or saunas. Do not douche or use tampons or scented sanitary pads. Do not cross your legs for long periods of time. Avoid cat litter boxes and soil used by cats. These carry germs that can cause birth defects in the baby and possibly loss of the fetus by miscarriage or stillbirth. Avoid all smoking, herbs, alcohol, and medicines not prescribed by your health care provider. Chemicals in these affect the formation and growth of the baby. Schedule a dentist appointment. At home, brush your teeth with a soft toothbrush and be gentle when you floss. SEEK  MEDICAL CARE IF:  You have dizziness. You have mild pelvic cramps, pelvic pressure, or nagging pain in the abdominal area. You have persistent nausea, vomiting, or diarrhea. You have a bad smelling vaginal discharge. You have pain with urination. You notice increased swelling in your face, hands, legs, or ankles. SEEK IMMEDIATE MEDICAL CARE IF:  You have a fever. You are leaking fluid from your vagina. You have spotting or bleeding from your vagina. You have severe abdominal cramping or pain. You have rapid weight gain or loss. You vomit blood or material that looks like coffee grounds. You are exposed to German measles and have never had them. You are exposed to fifth disease or chickenpox. You develop a severe headache. You have shortness of breath. You have any kind of trauma, such as from a fall or a car accident. Document Released: 04/28/2001 Document Revised: 09/18/2013 Document Reviewed: 03/14/2013 ExitCare Patient Information 2015 [employer]. This information is not intended to replace advice given to you by your health care provider. Make sure you discuss any questions you have with your health care provider.  

## 2021-07-14 NOTE — Progress Notes (Signed)
[place] 12+4 wks,measurements c/w dates,CRL 64.86,NB present,NT 1.4 mm,FHR 137 bpm,normal ovaries

## 2021-07-14 NOTE — Progress Notes (Signed)
INITIAL OBSTETRICAL VISIT Patient name: Suzanne Mack MRN [id number]  Date of birth: [birth year] Chief Complaint:   Initial Prenatal Visit and Pregnancy Ultrasound  History of Present Illness:   Suzanne Mack is a 26 y.o. G37P1001 Caucasian female at [redacted]w[redacted]d by LMP c/w u/s at 8 weeks with an Estimated Date of Delivery: 01/22/22 being seen today for her initial obstetrical visit.   Patient's last menstrual period was 04/17/2021. Her obstetrical history is significant for  term SVB x 1 in [facility]. Had A1DM during the pregnancy .   Today she reports N/V, requests rx Last pap 05/15/21. Results were: NILM w/ HRHPV negative  Depression screen [facility] 2/9 07/14/2021 05/15/2021  Decreased Interest 2 1  Down, Depressed, Hopeless 0 1  PHQ - 2 Score 2 2  Altered sleeping 0 0  Tired, decreased energy 1 1  Change in appetite 0 0  Feeling bad or failure about yourself  0 1  Trouble concentrating 0 0  Moving slowly or fidgety/restless 0 0  Suicidal thoughts 0 0  PHQ-9 Score 3 4     GAD 7 : Generalized Anxiety Score 07/14/2021 05/15/2021  Nervous, Anxious, on Edge 1 0  Control/stop worrying 0 0  Worry too much - different things 0 0  Trouble relaxing 1 0  Restless 0 0  Easily annoyed or irritable 2 2  Afraid - awful might happen 1 1  Total GAD 7 Score 5 3     Review of Systems:   Pertinent items are noted in HPI Denies cramping/contractions, leakage of fluid, vaginal bleeding, abnormal vaginal discharge w/ itching/odor/irritation, headaches, visual changes, shortness of breath, chest pain, abdominal pain, severe nausea/vomiting, or problems with urination or bowel movements unless otherwise stated above.  Pertinent History Reviewed:  Reviewed past medical,surgical, social, obstetrical and family history.  Reviewed problem list, medications and allergies. OB History  Gravida Para Term Preterm AB Living  2 1 1  0 0 1  SAB IAB Ectopic Multiple Live Births  0 0 0   1    # Outcome  Date GA Lbr Len/2nd Weight Sex Delivery Anes PTL Lv  2 Current           1 Term  [redacted]w[redacted]d  6 lb 11 oz (3.033 kg) F Vag-Spont EPI N LIV   Physical Assessment:   Vitals:   07/14/21 1429  BP: 124/81  Pulse: (!) 101  Weight: 172 lb 3.2 oz (78.1 kg)  Body mass index is 30.5 kg/m.       Physical Examination:  General appearance - well appearing, and in no distress  Mental status - alert, oriented to person, place, and time  Psych:  She has a normal mood and affect  Skin - warm and dry, normal color, no suspicious lesions noted  Chest - effort normal, all lung fields clear to auscultation bilaterally  Heart - normal rate and regular rhythm  Abdomen - soft, nontender  Extremities:  No swelling or varicosities noted  Thin prep pap is not done   Chaperone: N/A    TODAY'S NT 07/16/21 12+4 wks,measurements c/w dates,CRL 64.86,NB present,NT 1.4 mm,FHR 137 bpm,normal ovaries  No results found for this or any previous visit (from the past 24 hour(s)).  Assessment & Plan:  1) Low-Risk Pregnancy G2P1001 at [redacted]w[redacted]d with an Estimated Date of Delivery: 01/22/22   2) Initial OB visit  3) N/V> rx diclegis  4) H/O GDM> A1C today  Meds:  Meds ordered this encounter  Medications  Blood Pressure Monitor MISC    Sig: For regular home bp monitoring during pregnancy    Dispense:  1 each    Refill:  0    Please mail to patient   Doxylamine-Pyridoxine (DICLEGIS) 10-10 MG TBEC    Sig: 2 tabs q hs, if sx persist add 1 tab q am on day 3, if sx persist add 1 tab q afternoon on day 4    Dispense:  100 tablet    Refill:  6    Order Specific Question:   Supervising Provider    Answer:   Duane Lope H [[id number]]    Initial labs obtained Continue prenatal vitamins Reviewed n/v relief measures and warning s/s to report Reviewed recommended weight gain based on pre-gravid BMI Encouraged well-balanced diet Genetic & carrier screening discussed: requests NT/IT, declines Panorama and Horizon  Ultrasound discussed;  fetal survey: requested CCNC completed> form faxed if has or is planning to apply for medicaid The nature of [employer] for [employer] with multiple MDs and other Advanced Practice Providers was explained to patient; also emphasized that fellows, residents, and students are part of our team. Does not have home bp cuff. Office bp cuff given: no. Rx sent: yes. Check bp weekly, let us know if consistently >140/90.    Follow-up: Return in about 4 weeks (around 08/11/2021) for LROB, 2nd IT, CNM, in person.   Orders Placed This Encounter  Procedures   GC/Chlamydia Probe Amp   Urine Culture   Integrated 1   CBC/D/Plt+RPR+Rh+ABO+RubIgG...   Hemoglobin A1c   POC Urinalysis Dipstick OB    Cheral Marker CNM, [facility] 07/14/2021 3:24 PM

## 2021-07-15 ENCOUNTER — Telehealth: Payer: Self-pay

## 2021-07-15 NOTE — Telephone Encounter (Signed)
Patient called and wanted to speak to a nurse about some information in her chart.  She states that she has never had postpartum depression but it is in her chart.

## 2021-07-16 DIAGNOSIS — Z419 Encounter for procedure for purposes other than remedying health state, unspecified: Secondary | ICD-10-CM | POA: Diagnosis not present

## 2021-07-16 LAB — URINE CULTURE

## 2021-07-16 LAB — GC/CHLAMYDIA PROBE AMP
Chlamydia trachomatis, NAA: NEGATIVE
Neisseria Gonorrhoeae by PCR: NEGATIVE

## 2021-07-16 LAB — STATUS REPORT

## 2021-07-18 LAB — CBC/D/PLT+RPR+RH+ABO+RUBIGG...
Antibody Screen: NEGATIVE
Basophils Absolute: 0 10*3/uL (ref 0.0–0.2)
Basos: 0 %
EOS (ABSOLUTE): 0.1 10*3/uL (ref 0.0–0.4)
Eos: 0 %
HCV Ab: NONREACTIVE
HIV Screen 4th Generation wRfx: NONREACTIVE
Hematocrit: 44.5 % (ref 34.0–46.6)
Hemoglobin: 14.9 g/dL (ref 11.1–15.9)
Hepatitis B Surface Ag: NEGATIVE
Immature Grans (Abs): 0.1 10*3/uL (ref 0.0–0.1)
Immature Granulocytes: 1 %
Lymphocytes Absolute: 2.3 10*3/uL (ref 0.7–3.1)
Lymphs: 16 %
MCH: 29.5 pg (ref 26.6–33.0)
MCHC: 33.5 g/dL (ref 31.5–35.7)
MCV: 88 fL (ref 79–97)
Monocytes Absolute: 0.9 10*3/uL (ref 0.1–0.9)
Monocytes: 6 %
Neutrophils Absolute: 11.5 10*3/uL — ABNORMAL HIGH (ref 1.4–7.0)
Neutrophils: 77 %
Platelets: 226 10*3/uL (ref 150–450)
RBC: 5.05 x10E6/uL (ref 3.77–5.28)
RDW: 12.8 % (ref 11.7–15.4)
RPR Ser Ql: NONREACTIVE
Rh Factor: POSITIVE
Rubella Antibodies, IGG: 1.41 index (ref >0.99)
WBC: 14.9 10*3/uL — ABNORMAL HIGH (ref 3.4–10.8)

## 2021-07-18 LAB — INTEGRATED 1
Crown Rump Length: 64.9 mm
Gest. Age on Collection Date: 12.7 weeks
Maternal Age at EDD: 27.4 yr
Nuchal Translucency (NT): 1.4 mm
Number of Fetuses: 1
PAPP-A Value: 495.2 ng/mL
Weight: 172 [lb_av]

## 2021-07-18 LAB — HCV INTERPRETATION

## 2021-07-18 LAB — HEMOGLOBIN A1C
Est. average glucose Bld gHb Est-mCnc: 108 mg/dL
Hgb A1c MFr Bld: 5.4 % (ref 4.8–5.6)

## 2021-07-31 ENCOUNTER — Telehealth: Payer: Self-pay | Admitting: *Deleted

## 2021-07-31 NOTE — Telephone Encounter (Signed)
Patient called stating she fell last night trying to get a stray dog off of her cat.  She is unsure if she fell on her stomach or on her side but has a small scratch on her arm. Denies bleeding but is having some cramping.  Encouraged patient to rest today, push extra fluids and take some Tylenol for cramping.  If she develops bleeding or worsening cramps, to let us know or [facility] to [facility]  Pt verbalized understanding with no further questions.  ?

## 2021-08-11 ENCOUNTER — Encounter: Payer: Self-pay | Admitting: Women's Health

## 2021-08-11 ENCOUNTER — Ambulatory Visit (INDEPENDENT_AMBULATORY_CARE_PROVIDER_SITE_OTHER): Payer: Medicaid Other | Admitting: Women's Health

## 2021-08-11 ENCOUNTER — Other Ambulatory Visit: Payer: Self-pay

## 2021-08-11 VITALS — BP 136/88 | HR 89 | Wt 175.0 lb

## 2021-08-11 DIAGNOSIS — Z363 Encounter for antenatal screening for malformations: Secondary | ICD-10-CM

## 2021-08-11 DIAGNOSIS — Z3482 Encounter for supervision of other normal pregnancy, second trimester: Secondary | ICD-10-CM

## 2021-08-11 DIAGNOSIS — Z3480 Encounter for supervision of other normal pregnancy, unspecified trimester: Secondary | ICD-10-CM

## 2021-08-11 DIAGNOSIS — Z1379 Encounter for other screening for genetic and chromosomal anomalies: Secondary | ICD-10-CM

## 2021-08-11 NOTE — Patient Instructions (Signed)
Toni Amend, thank you for choosing our office today! We appreciate the opportunity to meet your healthcare needs. You may receive a short survey by mail, e-mail, or through [employer]. If you are happy with your care we would appreciate if you could take just a few minutes to complete the survey questions. We read all of your comments and take your feedback very seriously. Thank you again for choosing our office.  ?Center for [employer] Team at [facility] ?[facility] at [facility] ?([address] [place], [place] [address]) ?Entrance C, located off of [address] ?Free 24/7 valet parking  ?Go to [email or web address] to register for FREE online childbirth classes ? ?Call the office [phone]) or go to [facility] if: ?You begin to severe cramping ?Your water breaks.  Sometimes it is a big gush of fluid, sometimes it is just a trickle that keeps getting your panties wet or running down your legs ?You have vaginal bleeding.  It is normal to have a small amount of spotting if your cervix was checked.  ? ?[place] Pediatricians/Family Doctors ?[facility]): [address], [phone]           ?[facility]: [address], [phone]                ?[facility] [address], [phone] (call to ask if accepting patients) ?[facility] Department: [address], [place], [phone]   ? ?[place] Pediatricians/Family Doctors ?[facility]): [address], [phone] ?[facility]: [address] [place], [phone] ?[facility] [place]: [address], [phone] ? ?[employer] Family Doctors  ?[facility]): [phone] ?[facility]: [address], [phone]  ? ?[facility] Family Doctors ?[facility]: [address] [phone] ? ?[facility]  ?[facility]: [phone], [phone] ? ?Home Blood Pressure Monitoring for Patients  ? ?Your provider has recommended that you check your blood pressure (BP) at least once a week at home. If you do not have a blood pressure cuff at home, one will be provided for you. Contact your provider if you have not received your monitor within 1 week.  ? ?Helpful Tips for Accurate Home Blood Pressure Checks  ?Don't smoke, exercise, or drink caffeine 30 minutes before checking your BP ?Use the restroom before checking your BP (a full bladder can raise your pressure) ?Relax in a comfortable upright chair ?Feet on the ground ?Left arm resting comfortably on a flat surface at the level of your heart ?Legs uncrossed ?Back supported ?Sit quietly and don't talk ?Place the cuff on your bare arm ?Adjust snuggly, so that only two fingertips can fit between your skin and the top of the cuff ?Check 2 readings separated by at least one minute ?Keep a log of your BP readings ?For a visual, please reference this diagram: http://ccnc.care/bpdiagram ? ?Provider Name: [facility]     Phone: [phone] ? ?Zone 1: ALL CLEAR  ?Continue to monitor your symptoms:  ?BP reading is less than 140 (top number) or less than 90 (bottom number)  ?No right upper stomach pain ?No headaches or seeing spots ?No feeling nauseated or throwing up ?No swelling in face and hands ? ?Zone 2: CAUTION ?Call your doctor's office for any of the following:  ?BP reading is greater than 140 (top number) or greater than  90 (bottom number)  ?Stomach pain under your ribs in the middle or right side ?Headaches or seeing spots ?Feeling nauseated or throwing up ?Swelling in face and hands ? ?Zone 3: EMERGENCY  ?Seek immediate medical care if you have any of the following:  ?BP reading is greater than160 (top number) or greater than 110 (bottom number) ?Severe headaches not improving with Tylenol ?Serious difficulty catching your breath ?Any worsening symptoms from  Zone 2  ? ?  ?Second Trimester of Pregnancy ?The second trimester is from week 14 through week 27 (months 4 through 6). The second trimester is often a time when you feel your best. Your body has adjusted to being pregnant, and you begin to feel better physically. Usually, morning sickness has lessened or quit completely, you may have more energy, and you may have an increase in appetite. The second trimester is also a time when the fetus is growing rapidly. At the end of the sixth month, the fetus is about 9 inches long and weighs about 1? pounds. You will likely begin to feel the baby move (quickening) between 16 and 20 weeks of pregnancy. ?Body changes during your second trimester ?Your body continues to go through many changes during your second trimester. The changes vary from woman to woman. ?Your weight will continue to increase. You will notice your lower abdomen bulging out. ?You may begin to get stretch marks on your hips, abdomen, and breasts. ?You may develop headaches that can be relieved by medicines. The medicines should be approved by your health care provider. ?You may urinate more often because the fetus is pressing on your bladder. ?You may develop or continue to have heartburn as a result of your pregnancy. ?You may develop constipation because certain hormones are causing the muscles that push waste through your intestines to slow down. ?You may develop hemorrhoids or swollen, bulging veins (varicose veins). ?You may have back pain. This is caused by: ?Weight gain. ?Pregnancy hormones that are relaxing the joints in your pelvis. ?A shift in weight and the muscles that support your balance. ?Your breasts will continue to grow and they will continue to become tender. ?Your gums may bleed and may be sensitive to brushing and flossing. ?Dark spots or blotches (chloasma, mask of pregnancy) may develop on your face. This will likely fade after the baby is born. ?A dark line from your belly button to  the pubic area (linea nigra) may appear. This will likely fade after the baby is born. ?You may have changes in your hair. These can include thickening of your hair, rapid growth, and changes in texture. Some women also have hair loss during or after pregnancy, or hair that feels dry or thin. Your hair will most likely return to normal after your baby is born. ? ?What to expect at prenatal visits ?During a routine prenatal visit: ?You will be weighed to make sure you and the fetus are growing normally. ?Your blood pressure will be taken. ?Your abdomen will be measured to track your baby's growth. ?The fetal heartbeat will be listened to. ?Any test results from the previous visit will be discussed. ? ?Your health care provider may ask you: ?How you are feeling. ?If you are feeling the baby move. ?If you have had any abnormal symptoms, such as leaking fluid, bleeding, severe headaches, or abdominal cramping. ?If you are using any tobacco products, including cigarettes, chewing tobacco, and electronic cigarettes. ?If you have any questions. ? ?Other tests that may be performed during  your second trimester include: ?Blood tests that check for: ?Low iron levels (anemia). ?High blood sugar that affects pregnant women (gestational diabetes) between 44 and 28 weeks. ?Rh antibodies. This is to check for a protein on red blood cells (Rh factor). ?Urine tests to check for infections, diabetes, or protein in the urine. ?An ultrasound to confirm the proper growth and development of the baby. ?An amniocentesis to check for possible genetic problems. ?Fetal screens for spina bifida and Down syndrome. ?HIV (human immunodeficiency virus) testing. Routine prenatal testing includes screening for HIV, unless you choose not to have this test. ? ?Follow these instructions at home: ?Medicines ?Follow your health care provider's instructions regarding medicine use. Specific medicines may be either safe or unsafe to take during  pregnancy. ?Take a prenatal vitamin that contains at least 600 micrograms (mcg) of folic acid. ?If you develop constipation, try taking a stool softener if your health care provider approves. ?Eating and drinking ?Ea

## 2021-08-11 NOTE — Progress Notes (Signed)
? ? ?LOW-RISK PREGNANCY VISIT ?Patient name: Suzanne Mack MRN [id number]  Date of birth: 1995-07-01 ?Chief Complaint:   ?Routine Prenatal Visit ? ?History of Present Illness:   ?Raegan Winders is a 26 y.o. G14P1001 female at [redacted]w[redacted]d with an Estimated Date of Delivery: 01/22/22 being seen today for ongoing management of a low-risk pregnancy.  ? ?Today she reports no complaints. Contractions: Not present. Vag. Bleeding: None.  Movement: Absent. denies leaking of fluid. ? ? ?  07/14/2021  ?  2:32 PM 05/15/2021  ?  1:32 PM  ?Depression screen PHQ 2/9  ?Decreased Interest 2 1  ?Down, Depressed, Hopeless 0 1  ?PHQ - 2 Score 2 2  ?Altered sleeping 0 0  ?Tired, decreased energy 1 1  ?Change in appetite 0 0  ?Feeling bad or failure about yourself  0 1  ?Trouble concentrating 0 0  ?Moving slowly or fidgety/restless 0 0  ?Suicidal thoughts 0 0  ?PHQ-9 Score 3 4  ? ?  ? ?  07/14/2021  ?  2:33 PM 05/15/2021  ?  1:32 PM  ?GAD 7 : Generalized Anxiety Score  ?Nervous, Anxious, on Edge 1 0  ?Control/stop worrying 0 0  ?Worry too much - different things 0 0  ?Trouble relaxing 1 0  ?Restless 0 0  ?Easily annoyed or irritable 2 2  ?Afraid - awful might happen 1 1  ?Total GAD 7 Score 5 3  ? ? ?  ?Review of Systems:   ?Pertinent items are noted in HPI ?Denies abnormal vaginal discharge w/ itching/odor/irritation, headaches, visual changes, shortness of breath, chest pain, abdominal pain, severe nausea/vomiting, or problems with urination or bowel movements unless otherwise stated above. ?Pertinent History Reviewed:  ?Reviewed past medical,surgical, social, obstetrical and family history.  ?Reviewed problem list, medications and allergies. ?Physical Assessment:  ? ?Vitals:  ? 08/11/21 0934  ?BP: 136/88  ?Pulse: 89  ?Weight: 175 lb (79.4 kg)  ?Body mass index is 31 kg/m?. ?  ?     Physical Examination:  ? General appearance: Well appearing, and in no distress ? Mental status: Alert, oriented to person, place, and time ? Skin: Warm &  dry ? Cardiovascular: Normal heart rate noted ? Respiratory: Normal respiratory effort, no distress ? Abdomen: Soft, gravid, nontender ? Pelvic: Cervical exam deferred        ? Extremities: Edema: None ? ?Fetal Status: Fetal Heart Rate (bpm): 128   Movement: Absent   ? ?Chaperone: N/A   ?No results found for this or any previous visit (from the past 24 hour(s)).  ?Assessment & Plan:  ?1) Low-risk pregnancy G2P1001 at [redacted]w[redacted]d with an Estimated Date of Delivery: 01/22/22  ? ?2) H/O GDM, normal early A1C ?  ?Meds: No orders of the defined types were placed in this encounter. ? ?Labs/procedures today:  declines 2nd IT ? ?Plan:  Continue routine obstetrical care  ? ?Next visit: prefers will be in person for u/s    ? ?Reviewed: Preterm labor symptoms and general obstetric precautions including but not limited to vaginal bleeding, contractions, leaking of fluid and fetal movement were reviewed in detail with the patient.  All questions were answered. Does have home bp cuff. Office bp cuff given: not applicable. Check bp weekly, let us know if consistently >140 and/or >90. ? ?Follow-up: Return in about 3 weeks (around 09/01/2021) for LROB, VV:OHYWVPX, CNM, in person. ? ?Future Appointments  ?Date Time Provider Department Center  ?08/11/2021  9:50 AM Cheral Marker, CNM [facility]  ? ? ?  Orders Placed This Encounter  ?Procedures  ? US OB Comp + 14 Wk  ? ?Cheral Marker CNM, WHNP-BC ?08/11/2021 ?9:47 AM  ?

## 2021-08-16 DIAGNOSIS — Z419 Encounter for procedure for purposes other than remedying health state, unspecified: Secondary | ICD-10-CM | POA: Diagnosis not present

## 2021-08-25 ENCOUNTER — Encounter: Payer: Self-pay | Admitting: Women's Health

## 2021-09-03 ENCOUNTER — Other Ambulatory Visit: Payer: Self-pay | Admitting: Obstetrics & Gynecology

## 2021-09-03 ENCOUNTER — Encounter: Payer: Self-pay | Admitting: Obstetrics & Gynecology

## 2021-09-03 ENCOUNTER — Ambulatory Visit (INDEPENDENT_AMBULATORY_CARE_PROVIDER_SITE_OTHER): Payer: Medicaid Other

## 2021-09-03 ENCOUNTER — Ambulatory Visit (INDEPENDENT_AMBULATORY_CARE_PROVIDER_SITE_OTHER): Payer: Medicaid Other | Admitting: Obstetrics & Gynecology

## 2021-09-03 VITALS — BP 136/97 | HR 94 | Wt 172.4 lb

## 2021-09-03 DIAGNOSIS — Z363 Encounter for antenatal screening for malformations: Secondary | ICD-10-CM | POA: Diagnosis not present

## 2021-09-03 DIAGNOSIS — O283 Abnormal ultrasonic finding on antenatal screening of mother: Secondary | ICD-10-CM

## 2021-09-03 DIAGNOSIS — Z1379 Encounter for other screening for genetic and chromosomal anomalies: Secondary | ICD-10-CM | POA: Diagnosis not present

## 2021-09-03 DIAGNOSIS — Z3A19 19 weeks gestation of pregnancy: Secondary | ICD-10-CM

## 2021-09-03 DIAGNOSIS — Z3482 Encounter for supervision of other normal pregnancy, second trimester: Secondary | ICD-10-CM

## 2021-09-03 DIAGNOSIS — Z3480 Encounter for supervision of other normal pregnancy, unspecified trimester: Secondary | ICD-10-CM

## 2021-09-03 NOTE — Progress Notes (Signed)
[place] 19+6 wks,cephalic,posterior placenta gr 0,SVP of fluid 4.4 cm,EFW 324 g 51%,normal ovaries,cx length 3.8 cm,? abnormal 3VV and 3VTV,anatomy complete,discussed results with Dr.Ozan ?

## 2021-09-03 NOTE — Progress Notes (Signed)
? ?  LOW-RISK PREGNANCY VISIT ?Patient name: Suzanne Mack MRN [id number]  Date of birth: May 25, 1995 ?Chief Complaint:   ?Routine Prenatal Visit ([place] today; 2nd IT today) ? ?History of Present Illness:   ?Suzanne Mack is a 26 y.o. G2P1001 female at [redacted]w[redacted]d with an Estimated Date of Delivery: 01/22/22 being seen today for ongoing management of a low-risk pregnancy.  ? ? ? ?  07/14/2021  ?  2:32 PM 05/15/2021  ?  1:32 PM  ?Depression screen PHQ 2/9  ?Decreased Interest 2 1  ?Down, Depressed, Hopeless 0 1  ?PHQ - 2 Score 2 2  ?Altered sleeping 0 0  ?Tired, decreased energy 1 1  ?Change in appetite 0 0  ?Feeling bad or failure about yourself  0 1  ?Trouble concentrating 0 0  ?Moving slowly or fidgety/restless 0 0  ?Suicidal thoughts 0 0  ?PHQ-9 Score 3 4  ? ? ?Today she reports no complaints. Contractions: Not present. Vag. Bleeding: None.  Movement: Present. denies leaking of fluid. ?Review of Systems:   ?Pertinent items are noted in HPI ?Denies abnormal vaginal discharge w/ itching/odor/irritation, headaches, visual changes, shortness of breath, chest pain, abdominal pain, severe nausea/vomiting, or problems with urination or bowel movements unless otherwise stated above. ?Pertinent History Reviewed:  ?Reviewed past medical,surgical, social, obstetrical and family history.  ?Reviewed problem list, medications and allergies. ? ?Physical Assessment:  ? ?Vitals:  ? 09/03/21 1217  ?BP: (!) 136/97  ?Pulse: 94  ?Weight: 172 lb 6.4 oz (78.2 kg)  ?Body mass index is 30.54 kg/m?. ?  ?     Physical Examination:  ? General appearance: Well appearing, and in no distress ? Mental status: Alert, oriented to person, place, and time ? Skin: Warm & dry ? Respiratory: Normal respiratory effort, no distress ? Abdomen: Soft, gravid, nontender ? Pelvic: Cervical exam deferred        ? Extremities: Edema: None ? Psych:  mood and affect appropriate ? ?Fetal Status:     Movement: Present   ? ?Anatomy scan today- ,cephalic,posterior  placenta gr 0,SVP of fluid 4.4 cm,EFW 324 g 51%,normal ovaries,cx length 3.8 cm, concern for abnormal 3VV and 3VTV,anatomy complete ? ?Chaperone: n/a   ? ?No results found for this or any previous visit (from the past 24 hour(s)).  ? ?Assessment & Plan:  ?1) Low-risk pregnancy G2P1001 at [redacted]w[redacted]d with an Estimated Date of Delivery: 01/22/22  ? ?2) Concern for abnormal 3VV- referral created to MFM for repeat US/further evaluation ?  ?Meds: No orders of the defined types were placed in this encounter. ? ?Labs/procedures today: anatomy scan ? ?Plan:  Continue routine obstetrical care  ?Next visit: prefers in person   ? ?Reviewed: Preterm labor symptoms and general obstetric precautions including but not limited to vaginal bleeding, contractions, leaking of fluid and fetal movement were reviewed in detail with the patient.  All questions were answered.  ? ?Follow-up: Return in about 4 weeks (around 10/01/2021) for MFM [place] in 3-4wks, LROB visit. ? ?Orders Placed This Encounter  ?Procedures  ? INTEGRATED 2  ? ? ?Myna Hidalgo, DO ?Attending Obstetrician & Gynecologist, Faculty Practice ?Center for [employer], [facility] ? ? ? ?

## 2021-09-05 LAB — INTEGRATED 2
AFP MoM: 1.38
Alpha-Fetoprotein: 69 ng/mL
Crown Rump Length: 64.9 mm
DIA MoM: 0.31
DIA Value: 51.4 pg/mL
Estriol, Unconjugated: 1.73 ng/mL
Gest. Age on Collection Date: 12.7 weeks
Gestational Age: 20 weeks
Maternal Age at EDD: 27.4 yr
Nuchal Translucency (NT): 1.4 mm
Nuchal Translucency MoM: 0.96
Number of Fetuses: 1
PAPP-A MoM: 0.56
PAPP-A Value: 495.2 ng/mL
Test Results:: NEGATIVE
Weight: 172 [lb_av]
Weight: 172 [lb_av]
hCG MoM: 0.69
hCG Value: 14.5 IU/mL
uE3 MoM: 0.83

## 2021-09-08 ENCOUNTER — Encounter: Payer: Medicaid Other | Admitting: Obstetrics & Gynecology

## 2021-09-09 DIAGNOSIS — Z3A20 20 weeks gestation of pregnancy: Secondary | ICD-10-CM | POA: Diagnosis not present

## 2021-09-09 DIAGNOSIS — O26899 Other specified pregnancy related conditions, unspecified trimester: Secondary | ICD-10-CM | POA: Diagnosis not present

## 2021-09-09 DIAGNOSIS — A084 Viral intestinal infection, unspecified: Secondary | ICD-10-CM | POA: Diagnosis not present

## 2021-09-15 DIAGNOSIS — Z419 Encounter for procedure for purposes other than remedying health state, unspecified: Secondary | ICD-10-CM | POA: Diagnosis not present

## 2021-09-24 ENCOUNTER — Ambulatory Visit: Payer: Medicaid Other | Attending: Maternal & Fetal Medicine | Admitting: Maternal & Fetal Medicine

## 2021-09-24 ENCOUNTER — Ambulatory Visit: Payer: Medicaid Other | Admitting: *Deleted

## 2021-09-24 ENCOUNTER — Ambulatory Visit: Payer: Medicaid Other | Attending: Obstetrics & Gynecology

## 2021-09-24 ENCOUNTER — Other Ambulatory Visit: Payer: Self-pay | Admitting: *Deleted

## 2021-09-24 VITALS — BP 132/81 | HR 90

## 2021-09-24 DIAGNOSIS — O283 Abnormal ultrasonic finding on antenatal screening of mother: Secondary | ICD-10-CM | POA: Diagnosis not present

## 2021-09-24 DIAGNOSIS — E669 Obesity, unspecified: Secondary | ICD-10-CM | POA: Diagnosis not present

## 2021-09-24 DIAGNOSIS — O99212 Obesity complicating pregnancy, second trimester: Secondary | ICD-10-CM | POA: Diagnosis not present

## 2021-09-24 DIAGNOSIS — Z3480 Encounter for supervision of other normal pregnancy, unspecified trimester: Secondary | ICD-10-CM

## 2021-09-24 DIAGNOSIS — Q203 Discordant ventriculoarterial connection: Secondary | ICD-10-CM

## 2021-09-24 DIAGNOSIS — Z3A22 22 weeks gestation of pregnancy: Secondary | ICD-10-CM | POA: Diagnosis not present

## 2021-09-24 DIAGNOSIS — Z363 Encounter for antenatal screening for malformations: Secondary | ICD-10-CM | POA: Diagnosis not present

## 2021-09-24 DIAGNOSIS — O365921 Maternal care for other known or suspected poor fetal growth, second trimester, fetus 1: Secondary | ICD-10-CM

## 2021-09-24 DIAGNOSIS — Q249 Congenital malformation of heart, unspecified: Secondary | ICD-10-CM

## 2021-09-24 DIAGNOSIS — O09292 Supervision of pregnancy with other poor reproductive or obstetric history, second trimester: Secondary | ICD-10-CM | POA: Diagnosis not present

## 2021-09-26 NOTE — Progress Notes (Signed)
MFM Consultation ? ?Suzanne Mack is a G2P1 who is seen today at the request of Dr. Myna Hidalgo for a detailed exam for an abnormal ultrasound of  ? ?She is overall doing well without complaints of preterm labor or preeclampsia. ? ?She declined genetic screening per her records but she recalls recent blood draw for cell free DNA.- no results are available in EPIC. ? ?Her pegnancy is complicated by elevated BMI of 30 and history of A1GDM.( she had a normal ealry [id number]). ? ?Single intrauterine pregnancy is observed.  ?Normal anatomy with measurements consistent with fetal growth restriction and an abnormal fetal cardiac exam. ?There is good fetal movement and amniotic fluid volume ?Suboptimal views of the fetal anatomy were obtained secondary to fetal position. ? ?I discussed with Suzanne Mack we obsereved an abnomral arrangement of the 3 vessel view. The 3vv is normal subclavian, aorta and pulmonary artery. Today we observe possible subclavian artery, pulmonary artery and aorta, suggesting that the PA and the aorta are transposed or there is a right aortic arch vs a left aortic arch. ? ?In addition we observe a pulmonary branching coming from the left ventricle vs the aorta and the aorta and pulmonary arrangement is in parallel vs crossing. ? 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  ? ?There are two forms of transposition of great arteries (TGA): complete and corrected. Complete TGA is a conotruncal anomaly characterized by a discordant ventriculoarterial connection. Complete TGA affects approximately 1:1000 to 1:3000 newborns. Complete TGA is defined by normal atrioventricular concordance associated with an abnormal ventriculoarterial connection, with the pulmonary artery arising from the morphologic left ventricle and the aorta emerging anterior from the morphologic right ventricle.  ? ?Complete TGA accounts for approximately 6-8% of all congenital heart  diseases at birth. Other cardiacanomalies are common; the most typical association are ventricular septal defects (30%), pulmonary stenosis (15%) and aortic arch abnormalities (5%). Atrioventricular valve abnormalities and overriding of the pulmonary artery are less frequent associated anomalies. The etiology is unknown. It is considered to be multi-factorial. Some risk factors such as maternal diabetes and exposure to drugs have been reported.  ? ?Complete TGA results from abnormal inversion and spiraling of the conus arteriosus and truncus arteriosus, which usually occurs in the 5th week of embryonic life. The atria and ventricles retain their normal anatomy, a subaortic infundibulum separates the aorta from the tricuspid valve, and there is a fibrous continuity between the mitral and pulmonary valves. Coronary arteries originate in the aortic sinus. The key problem in complete TGA is that the system and pulmonary circulations run in parallel, preventing normal oxygenation and resulting in severe hypoxemia after birth. In the immediate post-delivery period, the immediate initiation of progrstaglandin E1 infusion after birth is necessary in order to avoid ductus arteriosus closure and permit blood flow oxygenation. Cases with severe shunt restriction need atrial septostomy to permit blood mixture until the definitive surgery is performed.  ? ?The treatment of choice of complete TGA is the arterial switch procedure. It is usually performed during first days of life in the cases with intact ventricular septums and during the first 2 weeks in cases with a VSD. The long term survival rate of the switch procedure is greater than 90%. This corrective surgery usually results in normal quality of life. Pulmonary stenosis is the most frequent long-term complication, requiring reintervention or catheter repair. ? 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 This is  a complex cardiac defect, and may require surgery immediately post partum. Consultation with NICU as well as both pediatric cardiology and pediatric cardiothoracic surgery is advised. We will plan on delivery at around 39 weeks with coordination of care between obstetric and pediatric subspecialties. ? ?I have scheduled a fetal echocardiogram with [facility] in 1-2 weeks for consulation and confirmation. ? ?If confirmed we will revisit her cell free DNA results and if not available will discuss non-invasive vs diagnostic testing. I did review this plan in detail. ? ?All questions answered. ? ?She will return in 4 week after the fetal echocardiogram. ? ?I spent 30 minutes with > 50 % in face to face consulation. ? ?Novella Olive, MD. ?

## 2021-09-27 ENCOUNTER — Encounter: Payer: Self-pay | Admitting: Obstetrics & Gynecology

## 2021-09-27 DIAGNOSIS — Z8279 Family history of other congenital malformations, deformations and chromosomal abnormalities: Secondary | ICD-10-CM | POA: Insufficient documentation

## 2021-09-27 DIAGNOSIS — O358XX0 Maternal care for other (suspected) fetal abnormality and damage, not applicable or unspecified: Secondary | ICD-10-CM | POA: Insufficient documentation

## 2021-10-01 ENCOUNTER — Telehealth: Payer: Self-pay | Admitting: *Deleted

## 2021-10-07 ENCOUNTER — Encounter: Payer: Self-pay | Admitting: Obstetrics & Gynecology

## 2021-10-07 ENCOUNTER — Ambulatory Visit (INDEPENDENT_AMBULATORY_CARE_PROVIDER_SITE_OTHER): Payer: Medicaid Other | Admitting: Obstetrics & Gynecology

## 2021-10-07 VITALS — BP 128/87 | HR 93 | Wt 177.8 lb

## 2021-10-07 DIAGNOSIS — O283 Abnormal ultrasonic finding on antenatal screening of mother: Secondary | ICD-10-CM

## 2021-10-07 DIAGNOSIS — O0992 Supervision of high risk pregnancy, unspecified, second trimester: Secondary | ICD-10-CM

## 2021-10-07 DIAGNOSIS — Q249 Congenital malformation of heart, unspecified: Secondary | ICD-10-CM

## 2021-10-07 NOTE — Progress Notes (Signed)
HIGH-RISK PREGNANCY VISIT Patient name: Suzanne Mack MRN [id number]  Date of birth: 1995-11-06 Chief Complaint:   Routine Prenatal Visit  History of Present Illness:   Suzanne Mack is a 26 y.o. G74P1001 female at [redacted]w[redacted]d with an Estimated Date of Delivery: 01/22/22 being seen today for ongoing management of a high-risk pregnancy complicated by:  -Suspected TGA- pt to be [facility] for ECHO Per pt this has not yet been scheduled.    Today she reports no complaints.   Contractions: Not present. Vag. Bleeding: None.  Movement: Present. denies leaking of fluid.      07/14/2021    2:32 PM 05/15/2021    1:32 PM  Depression screen PHQ 2/9  Decreased Interest 2 1  Down, Depressed, Hopeless 0 1  PHQ - 2 Score 2 2  Altered sleeping 0 0  Tired, decreased energy 1 1  Change in appetite 0 0  Feeling bad or failure about yourself  0 1  Trouble concentrating 0 0  Moving slowly or fidgety/restless 0 0  Suicidal thoughts 0 0  PHQ-9 Score 3 4     Current Outpatient Medications  Medication Instructions   Blood Pressure Monitor MISC For regular home bp monitoring during pregnancy   Pediatric Multiple Vitamins (FLINSTONES GUMMIES OMEGA-3 DHA PO) Oral, Takes 2 daily     Review of Systems:   Pertinent items are noted in HPI Denies abnormal vaginal discharge w/ itching/odor/irritation, headaches, visual changes, shortness of breath, chest pain, abdominal pain, severe nausea/vomiting, or problems with urination or bowel movements unless otherwise stated above. Pertinent History Reviewed:  Reviewed past medical,surgical, social, obstetrical and family history.  Reviewed problem list, medications and allergies. Physical Assessment:   Vitals:   10/07/21 1123  BP: 128/87  Pulse: 93  Weight: 177 lb 12.8 oz (80.6 kg)  Body mass index is 31.5 kg/m.           Physical Examination:   General appearance: alert, well appearing, and in no distress  Mental status: normal mood, behavior,  speech, dress, motor activity, and thought processes  Skin: warm & dry   Extremities: Edema: None    Cardiovascular: normal heart rate noted  Respiratory: normal respiratory effort, no distress  Abdomen: gravid, soft, non-tender  Pelvic: Cervical exam deferred         Fetal Status: Fetal Heart Rate (bpm): 120 Fundal Height: 24 cm Movement: Present    Fetal Surveillance Testing today: doppler   Chaperone: N/A    No results found for this or any previous visit (from the past 24 hour(s)).   Assessment & Plan:  High-risk pregnancy: G2P1001 at [redacted]w[redacted]d with an Estimated Date of Delivery: 01/22/22   1) TGA- reviewed findings on last [place] with MFM -discussed next step of ECHO and mutli-speciality team involved with care following delivery -MFM follow up scheduled 6/7   Meds: No orders of the defined types were placed in this encounter.   Labs/procedures today: none  Treatment Plan:  -continue routine OB appts, PN-2 next visit  Reviewed: Preterm labor symptoms and general obstetric precautions including but not limited to vaginal bleeding, contractions, leaking of fluid and fetal movement were reviewed in detail with the patient.  All questions were answered.  Follow-up: Return in about 4 weeks (around 11/04/2021) for HROB visit and PN-2.   Future Appointments  Date Time Provider [place]  10/22/2021  1:45 PM [facility] NURSE [facility]  10/22/2021  2:00 PM [facility] US1 [facility]  11/04/2021  9:00 AM  [facility]  11/04/2021  9:50 AM E[place], Mertie Clause, MD [facility]    No orders of the defined types were placed in this encounter.   Janyth Pupa, DO Attending [place], [facility] for [employer], [place]

## 2021-10-09 DIAGNOSIS — O283 Abnormal ultrasonic finding on antenatal screening of mother: Secondary | ICD-10-CM | POA: Diagnosis not present

## 2021-10-09 DIAGNOSIS — Z3A25 25 weeks gestation of pregnancy: Secondary | ICD-10-CM | POA: Diagnosis not present

## 2021-10-16 DIAGNOSIS — Z419 Encounter for procedure for purposes other than remedying health state, unspecified: Secondary | ICD-10-CM | POA: Diagnosis not present

## 2021-10-22 ENCOUNTER — Ambulatory Visit: Payer: Medicaid Other | Admitting: *Deleted

## 2021-10-22 ENCOUNTER — Ambulatory Visit: Payer: Medicaid Other | Attending: Maternal & Fetal Medicine

## 2021-10-22 VITALS — BP 133/81 | HR 101

## 2021-10-22 DIAGNOSIS — O99212 Obesity complicating pregnancy, second trimester: Secondary | ICD-10-CM | POA: Insufficient documentation

## 2021-10-22 DIAGNOSIS — E669 Obesity, unspecified: Secondary | ICD-10-CM | POA: Diagnosis not present

## 2021-10-22 DIAGNOSIS — Z3A26 26 weeks gestation of pregnancy: Secondary | ICD-10-CM | POA: Diagnosis not present

## 2021-10-22 DIAGNOSIS — Z3480 Encounter for supervision of other normal pregnancy, unspecified trimester: Secondary | ICD-10-CM | POA: Insufficient documentation

## 2021-10-22 DIAGNOSIS — O283 Abnormal ultrasonic finding on antenatal screening of mother: Secondary | ICD-10-CM | POA: Insufficient documentation

## 2021-10-22 DIAGNOSIS — Z363 Encounter for antenatal screening for malformations: Secondary | ICD-10-CM | POA: Diagnosis not present

## 2021-10-22 DIAGNOSIS — O35BXX0 Maternal care for other (suspected) fetal abnormality and damage, fetal cardiac anomalies, not applicable or unspecified: Secondary | ICD-10-CM | POA: Diagnosis not present

## 2021-10-22 DIAGNOSIS — O365921 Maternal care for other known or suspected poor fetal growth, second trimester, fetus 1: Secondary | ICD-10-CM | POA: Insufficient documentation

## 2021-10-22 DIAGNOSIS — O09292 Supervision of pregnancy with other poor reproductive or obstetric history, second trimester: Secondary | ICD-10-CM | POA: Diagnosis not present

## 2021-10-22 DIAGNOSIS — O09299 Supervision of pregnancy with other poor reproductive or obstetric history, unspecified trimester: Secondary | ICD-10-CM

## 2021-10-22 DIAGNOSIS — Q203 Discordant ventriculoarterial connection: Secondary | ICD-10-CM | POA: Insufficient documentation

## 2021-10-22 DIAGNOSIS — O365920 Maternal care for other known or suspected poor fetal growth, second trimester, not applicable or unspecified: Secondary | ICD-10-CM | POA: Diagnosis not present

## 2021-10-22 DIAGNOSIS — O321XX0 Maternal care for breech presentation, not applicable or unspecified: Secondary | ICD-10-CM | POA: Insufficient documentation

## 2021-10-23 ENCOUNTER — Other Ambulatory Visit: Payer: Self-pay | Admitting: *Deleted

## 2021-10-23 DIAGNOSIS — O99212 Obesity complicating pregnancy, second trimester: Secondary | ICD-10-CM

## 2021-10-23 DIAGNOSIS — O09292 Supervision of pregnancy with other poor reproductive or obstetric history, second trimester: Secondary | ICD-10-CM

## 2021-10-23 DIAGNOSIS — O365990 Maternal care for other known or suspected poor fetal growth, unspecified trimester, not applicable or unspecified: Secondary | ICD-10-CM

## 2021-10-31 ENCOUNTER — Encounter: Payer: Self-pay | Admitting: Obstetrics & Gynecology

## 2021-11-04 ENCOUNTER — Ambulatory Visit (INDEPENDENT_AMBULATORY_CARE_PROVIDER_SITE_OTHER): Payer: Medicaid Other | Admitting: Obstetrics & Gynecology

## 2021-11-04 ENCOUNTER — Encounter: Payer: Self-pay | Admitting: Obstetrics & Gynecology

## 2021-11-04 ENCOUNTER — Other Ambulatory Visit: Payer: Medicaid Other

## 2021-11-04 VITALS — BP 127/87 | HR 82 | Wt 181.0 lb

## 2021-11-04 DIAGNOSIS — Z131 Encounter for screening for diabetes mellitus: Secondary | ICD-10-CM

## 2021-11-04 DIAGNOSIS — Z3480 Encounter for supervision of other normal pregnancy, unspecified trimester: Secondary | ICD-10-CM

## 2021-11-04 DIAGNOSIS — Z3A28 28 weeks gestation of pregnancy: Secondary | ICD-10-CM

## 2021-11-04 NOTE — Progress Notes (Signed)
   LOW-RISK PREGNANCY VISIT Patient name: Suzanne Mack MRN [id number]  Date of birth: 1996/04/25 Chief Complaint:   Routine Prenatal Visit  History of Present Illness:   Suzanne Mack is a 26 y.o. G60P1001 female at [redacted]w[redacted]d with an Estimated Date of Delivery: 01/22/22 being seen today for ongoing management of a low-risk pregnancy.     11/04/2021   10:08 AM 07/14/2021    2:32 PM 05/15/2021    1:32 PM  Depression screen PHQ 2/9  Decreased Interest 0 2 1  Down, Depressed, Hopeless 0 0 1  PHQ - 2 Score 0 2 2  Altered sleeping 0 0 0  Tired, decreased energy 2 1 1   Change in appetite 0 0 0  Feeling bad or failure about yourself  0 0 1  Trouble concentrating 0 0 0  Moving slowly or fidgety/restless 0 0 0  Suicidal thoughts 0 0 0  PHQ-9 Score 2 3 4     Today she reports no complaints. Contractions: Not present. Vag. Bleeding: None.  Movement: Present. denies leaking of fluid. Review of Systems:   Pertinent items are noted in HPI Denies abnormal vaginal discharge w/ itching/odor/irritation, headaches, visual changes, shortness of breath, chest pain, abdominal pain, severe nausea/vomiting, or problems with urination or bowel movements unless otherwise stated above. Pertinent History Reviewed:  Reviewed past medical,surgical, social, obstetrical and family history.  Reviewed problem list, medications and allergies. Physical Assessment:   Vitals:   11/04/21 1003  BP: 127/87  Pulse: 82  Weight: 181 lb (82.1 kg)  Body mass index is 32.06 kg/m.        Physical Examination:   General appearance: Well appearing, and in no distress  Mental status: Alert, oriented to person, place, and time  Skin: Warm & dry  Cardiovascular: Normal heart rate noted  Respiratory: Normal respiratory effort, no distress  Abdomen: Soft, gravid, nontender  Pelvic: Cervical exam deferred         Extremities: Edema: None  Fetal Status:     Movement: Present    Chaperone: n/a    No results found for  this or any previous visit (from the past 24 hour(s)).  Assessment & Plan:  1) Low-risk pregnancy G2P1001 at [redacted]w[redacted]d with an Estimated Date of Delivery: 01/22/22   2) will check 1 hour glucola today,    Meds: No orders of the defined types were placed in this encounter.  Labs/procedures today: pt declines her PN2 labs today, I negotiated trying to get her to do a 1 hour test, as she states she had negative experiences in the past, including the last pregnancy.    Plan:  Continue routine obstetrical care  Next visit: prefers in person    Reviewed: Preterm labor symptoms and general obstetric precautions including but not limited to vaginal bleeding, contractions, leaking of fluid and fetal movement were reviewed in detail with the patient.  All questions were answered. Has home bp cuff. Rx faxed to . Check bp weekly, let [redacted]w[redacted]d know if >140/90.   Follow-up: Return in about 3 weeks (around 11/25/2021) for LROB.  Orders Placed This Encounter  Procedures   Glucose tolerance, 1 hour    [place], MD 11/04/2021 10:50 AM

## 2021-11-05 LAB — CBC
Hematocrit: 37.8 % (ref 34.0–46.6)
Hemoglobin: 12.9 g/dL (ref 11.1–15.9)
MCH: 30 pg (ref 26.6–33.0)
MCHC: 34.1 g/dL (ref 31.5–35.7)
MCV: 88 fL (ref 79–97)
Platelets: 154 10*3/uL (ref 150–450)
RBC: 4.3 x10E6/uL (ref 3.77–5.28)
RDW: 13 % (ref 11.7–15.4)
WBC: 10.2 10*3/uL (ref 3.4–10.8)

## 2021-11-05 LAB — RPR: RPR Ser Ql: NONREACTIVE

## 2021-11-05 LAB — ANTIBODY SCREEN: Antibody Screen: NEGATIVE

## 2021-11-05 LAB — HIV ANTIBODY (ROUTINE TESTING W REFLEX): HIV Screen 4th Generation wRfx: NONREACTIVE

## 2021-11-05 LAB — GLUCOSE TOLERANCE, 1 HOUR: Glucose, 1Hr PP: 204 mg/dL — ABNORMAL HIGH (ref 70–199)

## 2021-11-06 ENCOUNTER — Other Ambulatory Visit: Payer: Self-pay | Admitting: *Deleted

## 2021-11-06 ENCOUNTER — Encounter: Payer: Self-pay | Admitting: *Deleted

## 2021-11-06 DIAGNOSIS — O24410 Gestational diabetes mellitus in pregnancy, diet controlled: Secondary | ICD-10-CM | POA: Insufficient documentation

## 2021-11-06 MED ORDER — ACCU-CHEK SOFTCLIX LANCETS MISC: 12 refills | Status: DC

## 2021-11-06 MED ORDER — ACCU-CHEK GUIDE ME W/DEVICE KIT: 1.0000 | PACK | Freq: Four times a day (QID) | 0 refills | Status: DC

## 2021-11-06 MED ORDER — ACCU-CHEK GUIDE VI STRP: ORAL_STRIP | 12 refills | Status: DC

## 2021-11-13 ENCOUNTER — Encounter: Payer: Medicaid Other | Attending: Obstetrics & Gynecology | Admitting: Registered"

## 2021-11-13 ENCOUNTER — Other Ambulatory Visit: Payer: Self-pay

## 2021-11-13 ENCOUNTER — Ambulatory Visit (INDEPENDENT_AMBULATORY_CARE_PROVIDER_SITE_OTHER): Payer: Medicaid Other | Admitting: Registered"

## 2021-11-13 DIAGNOSIS — O24410 Gestational diabetes mellitus in pregnancy, diet controlled: Secondary | ICD-10-CM | POA: Diagnosis not present

## 2021-11-13 DIAGNOSIS — Z713 Dietary counseling and surveillance: Secondary | ICD-10-CM | POA: Diagnosis not present

## 2021-11-13 NOTE — Progress Notes (Signed)
Patient was seen for Gestational Diabetes self-management on 11/13/21  Start time 0825 and End time 0920   Estimated due date: 01/22/2022; [redacted]w[redacted]d  Clinical: Medications: reviewed Medical History: GDM Labs: OGTT 1 hr 204, A1c 5.4%   Dietary and Lifestyle History: Pt states she doesn't cook much and her friend invites her to eat meals with her. Pt states she did not check her blood sugar often with last GDM and did not take medications.  Pt reports that does not drink milk due to vomiting. Pt reports she tolerates other forms of dairy. Has not tried [place] yogurt yet. Pt states she enjoys salads and eats them often.  Physical Activity: not assessed Stress: not assessed Sleep: not assessed  24 hr Recall:  First Meal: waffle, water Snack: Second meal: skip? Snack: Third meal: chicken, fried rice, corn Snack: Beverages: water, 1/2 sweet tea sometimes  NUTRITION INTERVENTION  Nutrition education (E-1) on the following topics:   Initial Follow-up  [x]  []  Definition of Gestational Diabetes [x]  []  Why dietary management is important in controlling blood glucose [x]  []  Effects each nutrient has on blood glucose levels [x]  []  Simple carbohydrates vs complex carbohydrates [x]  []  Fluid intake [x]  []  Creating a balanced meal plan [x]  []  Carbohydrate counting  [x]  []  When to check blood glucose levels [x]  []  Proper blood glucose monitoring techniques [x]  []  Effect of stress and stress reduction techniques  [x]  []  Exercise effect on blood glucose levels, appropriate exercise during pregnancy [x]  []  Importance of limiting caffeine and abstaining from alcohol and smoking [x]  []  Medications used for blood sugar control during pregnancy [x]  []  Hypoglycemia and rule of 15 [x]  []  Postpartum self care  Patient instructed to monitor glucose levels: FBS: 60 - ? 95 mg/dL (some clinics use 90 for cutoff) 1 hour: ? 140 mg/dL 2 hour: ? mg/dL  Patient received handouts: Nutrition Diabetes  and Pregnancy Carbohydrate Counting List  Patient will be seen for follow-up as needed.

## 2021-11-15 DIAGNOSIS — Z419 Encounter for procedure for purposes other than remedying health state, unspecified: Secondary | ICD-10-CM | POA: Diagnosis not present

## 2021-11-20 ENCOUNTER — Encounter: Payer: Self-pay | Admitting: *Deleted

## 2021-11-20 ENCOUNTER — Other Ambulatory Visit: Payer: Self-pay | Admitting: *Deleted

## 2021-11-20 ENCOUNTER — Ambulatory Visit: Payer: Medicaid Other | Admitting: *Deleted

## 2021-11-20 ENCOUNTER — Ambulatory Visit: Payer: Medicaid Other | Attending: Maternal & Fetal Medicine

## 2021-11-20 VITALS — BP 132/82 | HR 86

## 2021-11-20 DIAGNOSIS — O24410 Gestational diabetes mellitus in pregnancy, diet controlled: Secondary | ICD-10-CM

## 2021-11-20 DIAGNOSIS — O99212 Obesity complicating pregnancy, second trimester: Secondary | ICD-10-CM

## 2021-11-20 DIAGNOSIS — O0993 Supervision of high risk pregnancy, unspecified, third trimester: Secondary | ICD-10-CM

## 2021-11-20 DIAGNOSIS — O09292 Supervision of pregnancy with other poor reproductive or obstetric history, second trimester: Secondary | ICD-10-CM

## 2021-11-20 DIAGNOSIS — E669 Obesity, unspecified: Secondary | ICD-10-CM

## 2021-11-20 DIAGNOSIS — O09299 Supervision of pregnancy with other poor reproductive or obstetric history, unspecified trimester: Secondary | ICD-10-CM

## 2021-11-20 DIAGNOSIS — O365990 Maternal care for other known or suspected poor fetal growth, unspecified trimester, not applicable or unspecified: Secondary | ICD-10-CM

## 2021-11-20 DIAGNOSIS — O365930 Maternal care for other known or suspected poor fetal growth, third trimester, not applicable or unspecified: Secondary | ICD-10-CM

## 2021-11-20 DIAGNOSIS — Z8632 Personal history of gestational diabetes: Secondary | ICD-10-CM | POA: Diagnosis not present

## 2021-11-20 DIAGNOSIS — Z3A31 31 weeks gestation of pregnancy: Secondary | ICD-10-CM | POA: Diagnosis not present

## 2021-11-20 DIAGNOSIS — R638 Other symptoms and signs concerning food and fluid intake: Secondary | ICD-10-CM

## 2021-11-26 ENCOUNTER — Encounter: Payer: Self-pay | Admitting: Advanced Practice Midwife

## 2021-11-26 ENCOUNTER — Ambulatory Visit (INDEPENDENT_AMBULATORY_CARE_PROVIDER_SITE_OTHER): Payer: Medicaid Other | Admitting: Advanced Practice Midwife

## 2021-11-26 VITALS — BP 132/88 | HR 84 | Wt 182.0 lb

## 2021-11-26 DIAGNOSIS — O358XX0 Maternal care for other (suspected) fetal abnormality and damage, not applicable or unspecified: Secondary | ICD-10-CM

## 2021-11-26 DIAGNOSIS — Z331 Pregnant state, incidental: Secondary | ICD-10-CM

## 2021-11-26 DIAGNOSIS — O24410 Gestational diabetes mellitus in pregnancy, diet controlled: Secondary | ICD-10-CM

## 2021-11-26 DIAGNOSIS — Z3A31 31 weeks gestation of pregnancy: Secondary | ICD-10-CM

## 2021-11-26 DIAGNOSIS — O0993 Supervision of high risk pregnancy, unspecified, third trimester: Secondary | ICD-10-CM

## 2021-11-26 DIAGNOSIS — Z1389 Encounter for screening for other disorder: Secondary | ICD-10-CM

## 2021-11-26 LAB — POCT URINALYSIS DIPSTICK OB
Blood, UA: NEGATIVE
Glucose, UA: NEGATIVE
Ketones, UA: NEGATIVE
Leukocytes, UA: NEGATIVE
Nitrite, UA: NEGATIVE

## 2021-11-26 MED ORDER — BLOOD PRESSURE MONITOR MISC: 0 refills | Status: DC

## 2021-11-26 NOTE — Patient Instructions (Signed)
Loma Sousa, thank you for choosing our office today! We appreciate the opportunity to meet your healthcare needs. You may receive a short survey by mail, e-mail, or through [employer]. If you are happy with your care we would appreciate if you could take just a few minutes to complete the survey questions. We read all of your comments and take your feedback very seriously. Thank you again for choosing our office.  Center [facility] [employer] Team at [place] at [facility] ([place] [address]) Entrance C, located off of [address] parking   CLASSES: Go to [email or web address] to register for classes (childbirth, breastfeeding, waterbirth, infant CPR, daddy bootcamp, etc.)  Call the office [phone]) or go to [facility] if: You begin to have strong, frequent contractions Your water breaks.  Sometimes it is a big gush of fluid, sometimes it is just a trickle that keeps getting your panties wet or running down your legs You have vaginal bleeding.  It is normal to have a small amount of spotting if your cervix was checked.  You don't feel your baby moving like normal.  If you don't, get you something to eat and drink and lay down and focus on feeling your baby move.   If your baby is still not moving like normal, you should call the office or go to [facility].  Call the office [phone]) or go to [facility] hospital for these signs of pre-eclampsia: Severe headache that does not go away with Tylenol Visual changes- seeing spots, double, blurred vision Pain under your right breast or upper abdomen that does not go away with Tums or heartburn medicine Nausea and/or vomiting Severe swelling in your hands, feet, and face   Tdap Vaccine It is recommended that you get the Tdap vaccine during the third trimester of EACH pregnancy to help protect your baby from getting pertussis (whooping cough) 27-36 weeks is the BEST time to do  this so that you can pass the protection on to your baby. During pregnancy is better than after pregnancy, but if you are unable to get it during pregnancy it will be offered at the hospital.  You can get this vaccine with [place], at the health department, your family doctor, or some local pharmacies Everyone who will be around your baby should also be up-to-date on their vaccines before the baby comes. Adults (who are not pregnant) only need 1 dose of Tdap during adulthood.   [facility] Pediatricians/Family Doctors [facility]): [address] [place], [facility]: [address] [place], [phone]                [facility]): [place], [phone] (call to ask if accepting patients) [facility] Department: [address], [place], [place] Pediatricians/Family Doctors [facility]): [place]. [place], [address], [place] [facility]: [address] [place], [place] [facility]: [place], [place] [facility]): [phone] [facility]: [address], [place]: [address] [place] [facility]: [phone], [phone]  Home Blood Pressure Monitoring for Patients   Your provider has recommended that you check your  blood pressure (BP) at least once a week at home. If you do not have a blood pressure cuff at home, one will be provided for you. Contact your provider if you have not received your monitor within 1 week.   Helpful Tips for Accurate Home Blood Pressure Checks  Don't smoke, exercise, or drink caffeine 30 minutes before checking your BP Use the restroom before checking your BP (a full bladder can raise your  pressure) Relax in a comfortable upright chair Feet on the ground Left arm resting comfortably on a flat surface at the level of your heart Legs uncrossed Back supported Sit quietly and don't talk Place the cuff on your bare arm Adjust snuggly, so that only two fingertips can fit between your skin and the top of the cuff Check 2 readings separated by at least one minute Keep a log of your BP readings For a visual, please reference this diagram: http://ccnc.care/bpdiagram  Provider Name: [facility]     Phone: [phone]  Zone 1: ALL CLEAR  Continue to monitor your symptoms:  BP reading is less than 140 (top number) or less than 90 (bottom number)  No right upper stomach pain No headaches or seeing spots No feeling nauseated or throwing up No swelling in face and hands  Zone 2: CAUTION Call your doctor's office for any of the following:  BP reading is greater than 140 (top number) or greater than 90 (bottom number)  Stomach pain under your ribs in the middle or right side Headaches or seeing spots Feeling nauseated or throwing up Swelling in face and hands  Zone 3: EMERGENCY  Seek immediate medical care if you have any of the following:  BP reading is greater than160 (top number) or greater than 110 (bottom number) Severe headaches not improving with Tylenol Serious difficulty catching your breath Any worsening symptoms from Zone 2   Third Trimester of Pregnancy The third trimester is from week 29 through week 42, months 7 through 9. The third trimester is a time when the fetus is growing rapidly. At the end of the ninth month, the fetus is about 20 inches in length and weighs 6-10 pounds.  BODY CHANGES Your body goes through many changes during pregnancy. The changes vary from woman to woman.  Your weight will continue to increase. You can expect to gain 25-35 pounds (11-16 kg) by the end of the pregnancy. You may begin to get stretch marks on your hips, abdomen,  and breasts. You may urinate more often because the fetus is moving lower into your pelvis and pressing on your bladder. You may develop or continue to have heartburn as a result of your pregnancy. You may develop constipation because certain hormones are causing the muscles that push waste through your intestines to slow down. You may develop hemorrhoids or swollen, bulging veins (varicose veins). You may have pelvic pain because of the weight gain and pregnancy hormones relaxing your joints between the bones in your pelvis. Backaches may result from overexertion of the muscles supporting your posture. You may have changes in your hair. These can include thickening of your hair, rapid growth, and changes in texture. Some women also have hair loss during or after pregnancy, or hair that feels dry or thin. Your hair will most likely return to normal after your baby is born. Your breasts will continue to grow and be tender. A yellow discharge may leak from your breasts called colostrum. Your belly button may stick out. You may   feel short of breath because of your expanding uterus. You may notice the fetus "dropping," or moving lower in your abdomen. You may have a bloody mucus discharge. This usually occurs a few days to a week before labor begins. Your cervix becomes thin and soft (effaced) near your due date. WHAT TO EXPECT AT YOUR PRENATAL EXAMS  You will have prenatal exams every 2 weeks until week 36. Then, you will have weekly prenatal exams. During a routine prenatal visit: You will be weighed to make sure you and the fetus are growing normally. Your blood pressure is taken. Your abdomen will be measured to track your baby's growth. The fetal heartbeat will be listened to. Any test results from the previous visit will be discussed. You may have a cervical check near your due date to see if you have effaced. At around 36 weeks, your caregiver will check your cervix. At the same time, your  caregiver will also perform a test on the secretions of the vaginal tissue. This test is to determine if a type of bacteria, Group B streptococcus, is present. Your caregiver will explain this further. Your caregiver may ask you: What your birth plan is. How you are feeling. If you are feeling the baby move. If you have had any abnormal symptoms, such as leaking fluid, bleeding, severe headaches, or abdominal cramping. If you have any questions. Other tests or screenings that may be performed during your third trimester include: Blood tests that check for low iron levels (anemia). Fetal testing to check the health, activity level, and growth of the fetus. Testing is done if you have certain medical conditions or if there are problems during the pregnancy. FALSE LABOR You may feel small, irregular contractions that eventually go away. These are called Braxton Hicks contractions, or false labor. Contractions may last for hours, days, or even weeks before true labor sets in. If contractions come at regular intervals, intensify, or become painful, it is best to be seen by your caregiver.  SIGNS OF LABOR  Menstrual-like cramps. Contractions that are 5 minutes apart or less. Contractions that start on the top of the uterus and spread down to the lower abdomen and back. A sense of increased pelvic pressure or back pain. A watery or bloody mucus discharge that comes from the vagina. If you have any of these signs before the 37th week of pregnancy, call your caregiver right away. You need to go to the hospital to get checked immediately. HOME CARE INSTRUCTIONS  Avoid all smoking, herbs, alcohol, and unprescribed drugs. These chemicals affect the formation and growth of the baby. Follow your caregiver's instructions regarding medicine use. There are medicines that are either safe or unsafe to take during pregnancy. Exercise only as directed by your caregiver. Experiencing uterine cramps is a good sign to  stop exercising. Continue to eat regular, healthy meals. Wear a good support bra for breast tenderness. Do not use hot tubs, steam rooms, or saunas. Wear your seat belt at all times when driving. Avoid raw meat, uncooked cheese, cat litter boxes, and soil used by cats. These carry germs that can cause birth defects in the baby. Take your prenatal vitamins. Try taking a stool softener (if your caregiver approves) if you develop constipation. Eat more high-fiber foods, such as fresh vegetables or fruit and whole grains. Drink plenty of fluids to keep your urine clear or pale yellow. Take warm sitz baths to soothe any pain or discomfort caused by hemorrhoids. Use hemorrhoid cream if   your caregiver approves. If you develop varicose veins, wear support hose. Elevate your feet for 15 minutes, 3-4 times a day. Limit salt in your diet. Avoid heavy lifting, wear low heal shoes, and practice good posture. Rest a lot with your legs elevated if you have leg cramps or low back pain. Visit your dentist if you have not gone during your pregnancy. Use a soft toothbrush to brush your teeth and be gentle when you floss. A sexual relationship may be continued unless your caregiver directs you otherwise. Do not travel far distances unless it is absolutely necessary and only with the approval of your caregiver. Take prenatal classes to understand, practice, and ask questions about the labor and delivery. Make a trial run to the hospital. Pack your hospital bag. Prepare the baby's nursery. Continue to go to all your prenatal visits as directed by your caregiver. SEEK MEDICAL CARE IF: You are unsure if you are in labor or if your water has broken. You have dizziness. You have mild pelvic cramps, pelvic pressure, or nagging pain in your abdominal area. You have persistent nausea, vomiting, or diarrhea. You have a bad smelling vaginal discharge. You have pain with urination. SEEK IMMEDIATE MEDICAL CARE IF:  You  have a fever. You are leaking fluid from your vagina. You have spotting or bleeding from your vagina. You have severe abdominal cramping or pain. You have rapid weight loss or gain. You have shortness of breath with chest pain. You notice sudden or extreme swelling of your face, hands, ankles, feet, or legs. You have not felt your baby move in over an hour. You have severe headaches that do not go away with medicine. You have vision changes. Document Released: 04/28/2001 Document Revised: 05/09/2013 Document Reviewed: 07/05/2012 [facility] Patient Information 2015 [place], [place]. This information is not intended to replace advice given to you by your health care provider. Make sure you discuss any questions you have with your health care provider.

## 2021-11-26 NOTE — Progress Notes (Signed)
HIGH-RISK PREGNANCY VISIT Patient name: Suzanne Mack MRN [id number]  Date of birth: 1996/03/07 Chief Complaint:   Routine Prenatal Visit and High Risk Gestation  History of Present Illness:   Suzanne Mack is a 26 y.o. G10P1001 female at [redacted]w[redacted]d with an Estimated Date of Delivery: 01/22/22 being seen today for ongoing management of a high-risk pregnancy complicated by diabetes mellitus A1DM.  Has taken very few blood sugars; she can recall a couple of fasting values (85, 94) and a couple of PP values that were 'mostly below 120' with one in the 150s. States that she is doing just what she did with her first pregnancy and her daughter 'did fine.'  Today she reports no complaints. Contractions: Not present.  .  Movement: Present. denies leaking of fluid.      11/04/2021   10:08 AM 07/14/2021    2:32 PM 05/15/2021    1:32 PM  Depression screen PHQ 2/9  Decreased Interest 0 2 1  Down, Depressed, Hopeless 0 0 1  PHQ - 2 Score 0 2 2  Altered sleeping 0 0 0  Tired, decreased energy 2 1 1   Change in appetite 0 0 0  Feeling bad or failure about yourself  0 0 1  Trouble concentrating 0 0 0  Moving slowly or fidgety/restless 0 0 0  Suicidal thoughts 0 0 0  PHQ-9 Score 2 3 4         11/04/2021   10:08 AM 07/14/2021    2:33 PM 05/15/2021    1:32 PM  GAD 7 : Generalized Anxiety Score  Nervous, Anxious, on Edge 0 1 0  Control/stop worrying 0 0 0  Worry too much - different things 0 0 0  Trouble relaxing 0 1 0  Restless 0 0 0  Easily annoyed or irritable 0 2 2  Afraid - awful might happen 0 1 1  Total GAD 7 Score 0 5 3     Review of Systems:   Pertinent items are noted in HPI Denies abnormal vaginal discharge w/ itching/odor/irritation, headaches, visual changes, shortness of breath, chest pain, abdominal pain, severe nausea/vomiting, or problems with urination or bowel movements unless otherwise stated above. Pertinent History Reviewed:  Reviewed past medical,surgical, social,  obstetrical and family history.  Reviewed problem list, medications and allergies. Physical Assessment:   Vitals:   11/26/21 1104  BP: 132/88  Pulse: 84  Weight: 182 lb (82.6 kg)  Body mass index is 32.24 kg/m.           Physical Examination:   General appearance: alert, well appearing, and in no distress  Mental status: alert, oriented to person, place, and time  Skin: warm & dry   Extremities: Edema: None    Cardiovascular: normal heart rate noted  Respiratory: normal respiratory effort, no distress  Abdomen: gravid, soft, non-tender  Pelvic: Cervical exam deferred         Fetal Status: Fetal Heart Rate (bpm): 125 Fundal Height: 32 cm Movement: Present    Fetal Surveillance Testing today: doppler    Results for orders placed or performed in visit on 11/26/21 (from the past 24 hour(s))  POC Urinalysis Dipstick OB   Collection Time: 11/26/21 11:23 AM  Result Value Ref Range   Color, UA     Clarity, UA     Glucose, UA Negative Negative   Bilirubin, UA     Ketones, UA neg    Spec Grav, UA     Blood, UA neg    pH,  UA     POC,PROTEIN,UA Trace Negative, Trace, Small (1+), Moderate (2+), Large (3+), 4+   Urobilinogen, UA     Nitrite, UA neg    Leukocytes, UA Negative Negative   Appearance     Odor      Assessment & Plan:  High-risk pregnancy: G2P1001 at [redacted]w[redacted]d with an Estimated Date of Delivery: 01/22/22   1) GDMA1, difficult to determine if well-controlled or not due to no log and infrequent testing; reviewing increased risk of stillbirth and SD with less optimal sugar control; is scheduled for growth u/s @ 35/36wks with MFM- wants to keep her appt there vs changing to FT  2) Abnormal 3VV/3VTV with nl fetal echo  Meds:  Meds ordered this encounter  Medications   Blood Pressure Monitor MISC    Sig: For regular home bp monitoring during pregnancy    Dispense:  1 each    Refill:  0    O09.91 Please mail to patient    Labs/procedures today: none  Treatment Plan:   Growth u/s @ 20, 28, 36wk     Deliver @ 39-40wks:____   Reviewed: Preterm labor symptoms and general obstetric precautions including but not limited to vaginal bleeding, contractions, leaking of fluid and fetal movement were reviewed in detail with the patient.  All questions were answered. Does not have home bp cuff- order given today. Office bp cuff given: no. Check bp weekly, let us know if consistently >140 and/or >90.  Follow-up: Return in about 2 weeks (around 12/10/2021) for HROB, in person.   Future Appointments  Date Time Provider Department Center  12/10/2021 11:30 AM Myna Hidalgo, DO [facility]  12/18/2021  3:30 PM [facility] NURSE [facility]  12/18/2021  3:45 PM [facility] US4 [facility]    Orders Placed This Encounter  Procedures   POC Urinalysis Dipstick OB   Suzanne Mack  11/26/2021 11:43 AM

## 2021-12-10 ENCOUNTER — Ambulatory Visit (INDEPENDENT_AMBULATORY_CARE_PROVIDER_SITE_OTHER): Payer: Medicaid Other | Admitting: Obstetrics & Gynecology

## 2021-12-10 ENCOUNTER — Encounter: Payer: Self-pay | Admitting: Obstetrics & Gynecology

## 2021-12-10 VITALS — BP 131/87 | HR 93 | Wt 181.2 lb

## 2021-12-10 DIAGNOSIS — O24410 Gestational diabetes mellitus in pregnancy, diet controlled: Secondary | ICD-10-CM

## 2021-12-10 DIAGNOSIS — O0993 Supervision of high risk pregnancy, unspecified, third trimester: Secondary | ICD-10-CM

## 2021-12-10 NOTE — Progress Notes (Signed)
HIGH-RISK PREGNANCY VISIT Patient name: Suzanne Mack MRN [id number]  Date of birth: Sep 23, 1995 Chief Complaint:   Routine Prenatal Visit  History of Present Illness:   Suzanne Mack is a 26 y.o. G26P1001 female at 58w6dwith an Estimated Date of Delivery: 01/22/22 being seen today for ongoing management of a high-risk pregnancy complicated by:  GDMA1 -does not have log.  Fastings- 85-90. After meals 100-125s.    Today she reports no complaints.   Contractions: Not present.  .  Movement: Present. denies leaking of fluid.      11/04/2021   10:08 AM 07/14/2021    2:32 PM 05/15/2021    1:32 PM  Depression screen PHQ 2/9  Decreased Interest 0 2 1  Down, Depressed, Hopeless 0 0 1  PHQ - 2 Score 0 2 2  Altered sleeping 0 0 0  Tired, decreased energy _0 Change in appetite 0 0 0  Feeling bad or failure about yourself  0 0 1  Trouble concentrating 0 0 0  Moving slowly or fidgety/restless 0 0 0  Suicidal thoughts 0 0 0  PHQ-9 Score _1 Current Outpatient Medications  Medication Instructions   Accu-Chek Softclix Lancets lancets Use as instructed to check blood sugar 4 times daily   Blood Glucose Monitoring Suppl (ACCU-CHEK GUIDE ME) w/Device KIT 1 each, Does not apply, 4 times daily   Blood Pressure Monitor MISC For regular home bp monitoring during pregnancy   glucose blood (ACCU-CHEK GUIDE) test strip Use as instructed to check blood sugar 4 times daily   Pediatric Multiple Vitamins (FLINSTONES GUMMIES OMEGA-3 DHA PO) Oral, Takes 2 daily     Review of Systems:   Pertinent items are noted in HPI Denies abnormal vaginal discharge w/ itching/odor/irritation, headaches, visual changes, shortness of breath, chest pain, abdominal pain, severe nausea/vomiting, or problems with urination or bowel movements unless otherwise stated above. Pertinent History Reviewed:  Reviewed past medical,surgical, social, obstetrical and family history.  Reviewed problem list,  medications and allergies. Physical Assessment:   Vitals:   12/10/21 1159  BP: 131/87  Pulse: 93  Weight: 181 lb 3.2 oz (82.2 kg)  Body mass index is 32.1 kg/m.           Physical Examination:   General appearance: alert, well appearing, and in no distress  Mental status: normal mood, behavior, speech, dress, motor activity, and thought processes  Skin: warm & dry   Extremities: Edema: None    Cardiovascular: normal heart rate noted  Respiratory: normal respiratory effort, no distress  Abdomen: gravid, soft, non-tender  Pelvic: Cervical exam deferred         Fetal Status: Fetal Heart Rate (bpm): 130 Fundal Height: 33 cm Movement: Present    Fetal Surveillance Testing today: doppler   Chaperone: N/A    No results found for this or any previous visit (from the past 24 hour(s)).   Assessment & Plan:  High-risk pregnancy: G2P1001 at 393w6dith an Estimated Date of Delivery: 01/22/22   1) GDMA1 -strongly encouraged pt to bring log thought currently it seems as though sugars are mostly within appropriate range -next growth 8/3, continue q 4wks  Meds: No orders of the defined types were placed in this encounter.   Labs/procedures today: doppler  Treatment Plan:  as outlined above, _2  next visit GBS swab  Reviewed: Preterm labor symptoms and general obstetric precautions including but not limited to vaginal bleeding, contractions, leaking of fluid and fetal  movement were reviewed in detail with the patient.  All questions were answered. Pt given cuff from office today.  Check bp weekly, let us know if >140/90.   Follow-up: Return in about 2 weeks (around 12/24/2021) for HROB visit and 4 week growth scan (4 wks from 8/3).   Future Appointments  Date Time Provider [place]  12/18/2021  3:30 PM [facility] NURSE [facility] [place]  12/18/2021  3:45 PM [facility] US4 [facility]  12/24/2021 11:50 AM Janyth Pupa, DO [facility]  01/15/2022 10:00 AM [facility] - [facility]YN [place] [facility] None   01/15/2022 11:10 AM Janyth Pupa, DO [facility]    No orders of the defined types were placed in this encounter.   Janyth Pupa, DO Attending [place], [facility] for [employer], [place]

## 2021-12-16 DIAGNOSIS — Z419 Encounter for procedure for purposes other than remedying health state, unspecified: Secondary | ICD-10-CM | POA: Diagnosis not present

## 2021-12-18 ENCOUNTER — Ambulatory Visit: Payer: Medicaid Other | Attending: Maternal & Fetal Medicine

## 2021-12-18 ENCOUNTER — Ambulatory Visit: Payer: Medicaid Other | Admitting: *Deleted

## 2021-12-18 VITALS — BP 123/88 | HR 76

## 2021-12-18 DIAGNOSIS — O09293 Supervision of pregnancy with other poor reproductive or obstetric history, third trimester: Secondary | ICD-10-CM

## 2021-12-18 DIAGNOSIS — O99213 Obesity complicating pregnancy, third trimester: Secondary | ICD-10-CM

## 2021-12-18 DIAGNOSIS — Z8632 Personal history of gestational diabetes: Secondary | ICD-10-CM | POA: Insufficient documentation

## 2021-12-18 DIAGNOSIS — Z3A35 35 weeks gestation of pregnancy: Secondary | ICD-10-CM | POA: Insufficient documentation

## 2021-12-18 DIAGNOSIS — R638 Other symptoms and signs concerning food and fluid intake: Secondary | ICD-10-CM | POA: Insufficient documentation

## 2021-12-18 DIAGNOSIS — O24410 Gestational diabetes mellitus in pregnancy, diet controlled: Secondary | ICD-10-CM | POA: Insufficient documentation

## 2021-12-18 DIAGNOSIS — O35EXX0 Maternal care for other (suspected) fetal abnormality and damage, fetal genitourinary anomalies, not applicable or unspecified: Secondary | ICD-10-CM

## 2021-12-18 DIAGNOSIS — O09299 Supervision of pregnancy with other poor reproductive or obstetric history, unspecified trimester: Secondary | ICD-10-CM | POA: Insufficient documentation

## 2021-12-18 DIAGNOSIS — E669 Obesity, unspecified: Secondary | ICD-10-CM | POA: Diagnosis not present

## 2021-12-18 DIAGNOSIS — O0993 Supervision of high risk pregnancy, unspecified, third trimester: Secondary | ICD-10-CM

## 2021-12-24 ENCOUNTER — Other Ambulatory Visit (HOSPITAL_COMMUNITY)
Admission: RE | Admit: 2021-12-24 | Discharge: 2021-12-24 | Disposition: A | Discharge status: Home / Self Care | Payer: Medicaid Other | Source: Ambulatory Visit | Attending: Obstetrics & Gynecology | Admitting: Obstetrics & Gynecology

## 2021-12-24 ENCOUNTER — Encounter: Payer: Self-pay | Admitting: Obstetrics & Gynecology

## 2021-12-24 ENCOUNTER — Ambulatory Visit (INDEPENDENT_AMBULATORY_CARE_PROVIDER_SITE_OTHER): Payer: Medicaid Other | Admitting: Obstetrics & Gynecology

## 2021-12-24 VITALS — BP 132/89 | HR 74 | Wt 180.4 lb

## 2021-12-24 DIAGNOSIS — O24410 Gestational diabetes mellitus in pregnancy, diet controlled: Secondary | ICD-10-CM

## 2021-12-24 DIAGNOSIS — Z3A35 35 weeks gestation of pregnancy: Secondary | ICD-10-CM | POA: Diagnosis not present

## 2021-12-24 DIAGNOSIS — O0993 Supervision of high risk pregnancy, unspecified, third trimester: Secondary | ICD-10-CM

## 2021-12-24 LAB — POCT URINALYSIS DIPSTICK OB
Blood, UA: NEGATIVE
Glucose, UA: NEGATIVE
Ketones, UA: NEGATIVE
Leukocytes, UA: NEGATIVE
Nitrite, UA: NEGATIVE

## 2021-12-24 NOTE — Progress Notes (Signed)
HIGH-RISK PREGNANCY VISIT Patient name: Suzanne Mack MRN [id number]  Date of birth: 1995/05/25 Chief Complaint:   High Risk Gestation  History of Present Illness:   Suzanne Mack is a 26 y.o. G9P1001 female at [redacted]w[redacted]d with an Estimated Date of Delivery: 01/22/22 being seen today for ongoing management of a high-risk pregnancy complicated by: -GDMA1- well controlled  Today she reports no complaints.   Contractions: Not present. Vag. Bleeding: None.  Movement: Present. denies leaking of fluid.      11/04/2021   10:08 AM 07/14/2021    2:32 PM 05/15/2021    1:32 PM  Depression screen PHQ 2/9  Decreased Interest 0 2 1  Down, Depressed, Hopeless 0 0 1  PHQ - 2 Score 0 2 2  Altered sleeping 0 0 0  Tired, decreased energy $RemoveBeforeDE'2 1 1  'BWyvxnPYsoACoFK$ Change in appetite 0 0 0  Feeling bad or failure about yourself  0 0 1  Trouble concentrating 0 0 0  Moving slowly or fidgety/restless 0 0 0  Suicidal thoughts 0 0 0  PHQ-9 Score $RemoveBef'2 3 4     'fylyyHMxLn$ Current Outpatient Medications  Medication Instructions   Accu-Chek Softclix Lancets lancets Use as instructed to check blood sugar 4 times daily   Blood Glucose Monitoring Suppl (ACCU-CHEK GUIDE ME) w/Device KIT 1 each, Does not apply, 4 times daily   Blood Pressure Monitor MISC For regular home bp monitoring during pregnancy   glucose blood (ACCU-CHEK GUIDE) test strip Use as instructed to check blood sugar 4 times daily   Pediatric Multiple Vitamins (FLINSTONES GUMMIES OMEGA-3 DHA PO) Oral, Takes 2 daily     Review of Systems:   Pertinent items are noted in HPI Denies abnormal vaginal discharge w/ itching/odor/irritation, headaches, visual changes, shortness of breath, chest pain, abdominal pain, severe nausea/vomiting, or problems with urination or bowel movements unless otherwise stated above. Pertinent History Reviewed:  Reviewed past medical,surgical, social, obstetrical and family history.  Reviewed problem list, medications and allergies. Physical  Assessment:   Vitals:   12/24/21 1147  BP: 132/89  Pulse: 74  Weight: 180 lb 6.4 oz (81.8 kg)  Body mass index is 31.96 kg/m.           Physical Examination:   General appearance: alert, well appearing, and in no distress  Mental status: normal mood, behavior, speech, dress, motor activity, and thought processes  Skin: warm & dry   Extremities: Edema: None    Cardiovascular: normal heart rate noted  Respiratory: normal respiratory effort, no distress  Abdomen: gravid, soft, non-tender  Pelvic: Cervical exam deferred  Vaginal swabs obtained       Fetal Status: Fetal Heart Rate (bpm): 120 Fundal Height: 34 cm Movement: Present    Fetal Surveillance Testing today: doppler   Chaperone:  pt declined     Results for orders placed or performed in visit on 12/24/21 (from the past 24 hour(s))  POC Urinalysis Dipstick OB   Collection Time: 12/24/21 11:44 AM  Result Value Ref Range   Color, UA     Clarity, UA     Glucose, UA Negative Negative   Bilirubin, UA     Ketones, UA neg    Spec Grav, UA     Blood, UA neg    pH, UA     POC,PROTEIN,UA Small (1+) Negative, Trace, Small (1+), Moderate (2+), Large (3+), 4+   Urobilinogen, UA     Nitrite, UA neg    Leukocytes, UA Negative Negative   Appearance  Odor       Assessment & Plan:  High-risk pregnancy: G2P1001 at [redacted]w[redacted]d with an Estimated Date of Delivery: 01/22/22   1) GDMA1 -doing well with diet only -continue growth q 4wks -discussed IOL 39-40wk  Meds: No orders of the defined types were placed in this encounter.   Labs/procedures today: GBS, GC/C collected  Treatment Plan:  as outlined above  Reviewed: Term labor symptoms and general obstetric precautions including but not limited to vaginal bleeding, contractions, leaking of fluid and fetal movement were reviewed in detail with the patient.  All questions were answered. Pt has home bp cuff. Check bp weekly, let us know if >140/90.   Follow-up: Return in about 1 week  (around 12/31/2021) for [place] visit.   Future Appointments  Date Time Provider [place]  12/31/2021  2:10 PM Myrtis Ser, CNM [facility]  01/13/2022 11:30 AM [facility] - [facility]YN [place] [facility] None  01/13/2022  1:50 PM Janyth Pupa, DO [facility]    Orders Placed This Encounter  Procedures   Strep Gp B NAA   POC Urinalysis Dipstick OB    Janyth Pupa, DO Attending [place] for [employer], [place]

## 2021-12-25 LAB — CERVICOVAGINAL ANCILLARY ONLY
Chlamydia: NEGATIVE
Comment: NEGATIVE
Comment: NORMAL
Neisseria Gonorrhea: NEGATIVE

## 2021-12-26 LAB — STREP GP B NAA: Strep Gp B NAA: NEGATIVE

## 2021-12-31 ENCOUNTER — Ambulatory Visit (INDEPENDENT_AMBULATORY_CARE_PROVIDER_SITE_OTHER): Payer: Medicaid Other | Admitting: Advanced Practice Midwife

## 2021-12-31 ENCOUNTER — Encounter: Payer: Self-pay | Admitting: Obstetrics & Gynecology

## 2021-12-31 VITALS — BP 132/89 | HR 86 | Wt 183.0 lb

## 2021-12-31 DIAGNOSIS — Z3A36 36 weeks gestation of pregnancy: Secondary | ICD-10-CM

## 2021-12-31 DIAGNOSIS — O0993 Supervision of high risk pregnancy, unspecified, third trimester: Secondary | ICD-10-CM

## 2021-12-31 NOTE — Progress Notes (Signed)
HIGH-RISK PREGNANCY VISIT Patient name: Suzanne Mack MRN [id number]  Date of birth: 11-20-1995 Chief Complaint:   Routine Prenatal Visit  History of Present Illness:   Suzanne Mack is a 26 y.o. G58P1001 female at [redacted]w[redacted]d with an Estimated Date of Delivery: 01/22/22 being seen today for ongoing management of a high-risk pregnancy complicated by diabetes mellitus A1DM.  Hasn't checked values since last visit- 'been busy with my daughter'.  Today she reports no complaints. Contractions: Not present. Vag. Bleeding: None.  Movement: Present. denies leaking of fluid.      11/04/2021   10:08 AM 07/14/2021    2:32 PM 05/15/2021    1:32 PM  Depression screen PHQ 2/9  Decreased Interest 0 2 1  Down, Depressed, Hopeless 0 0 1  PHQ - 2 Score 0 2 2  Altered sleeping 0 0 0  Tired, decreased energy 2 1 1   Change in appetite 0 0 0  Feeling bad or failure about yourself  0 0 1  Trouble concentrating 0 0 0  Moving slowly or fidgety/restless 0 0 0  Suicidal thoughts 0 0 0  PHQ-9 Score 2 3 4         11/04/2021   10:08 AM 07/14/2021    2:33 PM 05/15/2021    1:32 PM  GAD 7 : Generalized Anxiety Score  Nervous, Anxious, on Edge 0 1 0  Control/stop worrying 0 0 0  Worry too much - different things 0 0 0  Trouble relaxing 0 1 0  Restless 0 0 0  Easily annoyed or irritable 0 2 2  Afraid - awful might happen 0 1 1  Total GAD 7 Score 0 5 3     Review of Systems:   Pertinent items are noted in HPI Denies abnormal vaginal discharge w/ itching/odor/irritation, headaches, visual changes, shortness of breath, chest pain, abdominal pain, severe nausea/vomiting, or problems with urination or bowel movements unless otherwise stated above. Pertinent History Reviewed:  Reviewed past medical,surgical, social, obstetrical and family history.  Reviewed problem list, medications and allergies. Physical Assessment:   Vitals:   12/31/21 1417  BP: 132/89  Pulse: 86  Weight: 183 lb (83 kg)  Body  mass index is 32.42 kg/m.           Physical Examination:   General appearance: alert, well appearing, and in no distress  Mental status: alert, oriented to person, place, and time  Skin: warm & dry   Extremities: Edema: None    Cardiovascular: normal heart rate noted  Respiratory: normal respiratory effort, no distress  Abdomen: gravid, soft, non-tender  Pelvic: Cervical exam deferred         Fetal Status: Fetal Heart Rate (bpm): 122 Fundal Height: 35 cm Movement: Present    Fetal Surveillance Testing today: doppler    No results found for this or any previous visit (from the past 24 hour(s)).  Assessment & Plan:  High-risk pregnancy: G2P1001 at [redacted]w[redacted]d with an Estimated Date of Delivery: 01/22/22   1) GDMA1, hasn't checked sugars since last visit; encouraged to get some fasting and some PP values at least; has EFW already for 8/29; IOL 39-40wks   Meds: No orders of the defined types were placed in this encounter.   Labs/procedures today: none  Treatment Plan:  growth q 4wks; IOL 39-40wks  Reviewed: Term labor symptoms and general obstetric precautions including but not limited to vaginal bleeding, contractions, leaking of fluid and fetal movement were reviewed in detail with the patient.  All questions  were answered. Does have home bp cuff. Office bp cuff given: not applicable. Check bp daily, let us know if consistently >140 and/or >90.  Follow-up: Return for HROB next week.   Future Appointments  Date Time Provider Department Center  01/13/2022 11:30 AM [facility] - [facility] [place] [facility] None  01/13/2022  1:50 PM Myna Hidalgo, DO [facility]TO[facility]    No orders of the defined types were placed in this encounter.  Arabella Merles CNM 12/31/2021 2:39 PM

## 2022-01-07 ENCOUNTER — Ambulatory Visit (INDEPENDENT_AMBULATORY_CARE_PROVIDER_SITE_OTHER): Payer: Medicaid Other | Admitting: Obstetrics & Gynecology

## 2022-01-07 ENCOUNTER — Encounter: Payer: Self-pay | Admitting: Obstetrics & Gynecology

## 2022-01-07 VITALS — BP 137/97 | HR 81 | Wt 184.0 lb

## 2022-01-07 DIAGNOSIS — R030 Elevated blood-pressure reading, without diagnosis of hypertension: Secondary | ICD-10-CM

## 2022-01-07 DIAGNOSIS — O0993 Supervision of high risk pregnancy, unspecified, third trimester: Secondary | ICD-10-CM

## 2022-01-07 DIAGNOSIS — O24410 Gestational diabetes mellitus in pregnancy, diet controlled: Secondary | ICD-10-CM

## 2022-01-07 DIAGNOSIS — Z3A37 37 weeks gestation of pregnancy: Secondary | ICD-10-CM

## 2022-01-07 LAB — POCT URINALYSIS DIPSTICK OB
Blood, UA: NEGATIVE
Glucose, UA: NEGATIVE
Ketones, UA: NEGATIVE
Leukocytes, UA: NEGATIVE
Nitrite, UA: NEGATIVE
POC,PROTEIN,UA: NEGATIVE

## 2022-01-07 NOTE — Progress Notes (Signed)
HIGH-RISK PREGNANCY VISIT Patient name: Suzanne Mack MRN [id number]  Date of birth: 1995/09/01 Chief Complaint:   Routine Prenatal Visit  History of Present Illness:   Suzanne Mack is a 26 y.o. G13P1001 female at 40w6dwith an Estimated Date of Delivery: 01/22/22 being seen today for ongoing management of a high-risk pregnancy complicated by:  -GDMA1- well controlled  Today she reports  discomfort due to fetal movement .   Contractions: Not present. Vag. Bleeding: None.  Movement: Present. denies leaking of fluid.      11/04/2021   10:08 AM 07/14/2021    2:32 PM 05/15/2021    1:32 PM  Depression screen PHQ 2/9  Decreased Interest 0 2 1  Down, Depressed, Hopeless 0 0 1  PHQ - 2 Score 0 2 2  Altered sleeping 0 0 0  Tired, decreased energy _0 Change in appetite 0 0 0  Feeling bad or failure about yourself  0 0 1  Trouble concentrating 0 0 0  Moving slowly or fidgety/restless 0 0 0  Suicidal thoughts 0 0 0  PHQ-9 Score _1 Current Outpatient Medications  Medication Instructions   Accu-Chek Softclix Lancets lancets Use as instructed to check blood sugar 4 times daily   Blood Glucose Monitoring Suppl (ACCU-CHEK GUIDE ME) w/Device KIT 1 each, Does not apply, 4 times daily   Blood Pressure Monitor MISC For regular home bp monitoring during pregnancy   glucose blood (ACCU-CHEK GUIDE) test strip Use as instructed to check blood sugar 4 times daily   Pediatric Multiple Vitamins (FLINSTONES GUMMIES OMEGA-3 DHA PO) Oral, Takes 2 daily     Review of Systems:   Pertinent items are noted in HPI Denies abnormal vaginal discharge w/ itching/odor/irritation, headaches, visual changes, shortness of breath, chest pain, abdominal pain, severe nausea/vomiting, or problems with urination or bowel movements unless otherwise stated above. Pertinent History Reviewed:  Reviewed past medical,surgical, social, obstetrical and family history.  Reviewed problem list, medications  and allergies. Physical Assessment:   Vitals:   01/07/22 1613 01/07/22 1616 01/07/22 1620  BP: (!) 137/93 (!) 155/99 (!) 137/97  Pulse: 81    Weight: 184 lb (83.5 kg)    Body mass index is 32.59 kg/m.           Physical Examination:   General appearance: alert, well appearing, and in no distress  Mental status: normal mood, behavior, speech, dress, motor activity, and thought processes  Skin: warm & dry   Extremities: Edema: None    Cardiovascular: normal heart rate noted  Respiratory: normal respiratory effort, no distress  Abdomen: gravid, soft, non-tender  Pelvic: Cervical exam performed  Dilation: 1 Effacement (%): 20 Station: -3  Fetal Status: Fetal Heart Rate (bpm): 140 Fundal Height: 36 cm Movement: Present Presentation: Vertex  Fetal Surveillance Testing today: doppler   Chaperone:  pt declined     Results for orders placed or performed in visit on 01/07/22 (from the past 24 hour(s))  POC Urinalysis Dipstick OB   Collection Time: 01/07/22  4:15 PM  Result Value Ref Range   Color, UA     Clarity, UA     Glucose, UA Negative Negative   Bilirubin, UA     Ketones, UA neg    Spec Grav, UA     Blood, UA neg    pH, UA     POC,PROTEIN,UA Negative Negative, Trace, Small (1+), Moderate (2+), Large (3+), 4+   Urobilinogen, UA  Nitrite, UA neg    Leukocytes, UA Negative Negative   Appearance     Odor       Assessment & Plan:  High-risk pregnancy: G2P1001 at 25w6dwith an Estimated Date of Delivery: 01/22/22   1) Elevated BP- concern for gestational HTN -pt has been checking at home, could not remember numbers, but thinks its been less than 130/80s -pt asymptomatic, UA negative for protein -lab work today -repeat BP check tomorrow -discussed IOL if BP still elevated tomorrow -reviewed preeclampsia precautions  2) GDMA1 -continue with sugars checks  Meds: No orders of the defined types were placed in this encounter.   Labs/procedures today: doppler, CBC,  CMP, PC ratio  Treatment Plan:  as outlined above  Reviewed: Preterm labor symptoms and general obstetric precautions including but not limited to vaginal bleeding, contractions, leaking of fluid and fetal movement were reviewed in detail with the patient.  All questions were answered. Pt has home bp cuff. Check bp weekly, let u[place]now if >140/90.   Follow-up: Return for needs BP check on Thursday.   Future Appointments  Date Time Provider [place] 01/08/2022 10:10 AM [facility] NURSE [facility]  01/13/2022 11:30 AM [facility] [place] None  01/13/2022  1:50 PM OJanyth Pupa DO [facility]  01/21/2022  2:50 PM OJanyth Pupa DO [facility]    Orders Placed This Encounter  Procedures   Comprehensive metabolic panel   Protein / creatinine ratio, urine   CBC   POC Urinalysis Dipstick OB    JJanyth Pupa DO Attending [place] [facility]for [facility] [place]

## 2022-01-08 ENCOUNTER — Ambulatory Visit: Payer: Medicaid Other | Admitting: *Deleted

## 2022-01-08 ENCOUNTER — Other Ambulatory Visit: Payer: Self-pay | Admitting: Obstetrics & Gynecology

## 2022-01-08 VITALS — BP 130/92

## 2022-01-08 DIAGNOSIS — O24410 Gestational diabetes mellitus in pregnancy, diet controlled: Secondary | ICD-10-CM

## 2022-01-08 DIAGNOSIS — Z0130 Encounter for examination of blood pressure without abnormal findings: Secondary | ICD-10-CM

## 2022-01-08 DIAGNOSIS — R030 Elevated blood-pressure reading, without diagnosis of hypertension: Secondary | ICD-10-CM

## 2022-01-08 DIAGNOSIS — O0993 Supervision of high risk pregnancy, unspecified, third trimester: Secondary | ICD-10-CM

## 2022-01-08 DIAGNOSIS — O133 Gestational [pregnancy-induced] hypertension without significant proteinuria, third trimester: Secondary | ICD-10-CM

## 2022-01-08 LAB — POCT URINALYSIS DIPSTICK OB
Blood, UA: NEGATIVE
Glucose, UA: NEGATIVE
Ketones, UA: NEGATIVE
Leukocytes, UA: NEGATIVE
Nitrite, UA: NEGATIVE

## 2022-01-08 NOTE — Progress Notes (Signed)
Admission orders 

## 2022-01-08 NOTE — Progress Notes (Signed)
   NURSE VISIT- BLOOD PRESSURE CHECK  SUBJECTIVE:  Suzanne Mack is a 26 y.o. G53P1001 female here for BP check. She is [redacted]w[redacted]d pregnant    HYPERTENSION ROS:  Pregnant:  Severe headaches that don't go away with tylenol/other medicines: No  Visual changes (seeing spots/double/blurred vision) No  Severe pain under right breast breast or in center of upper chest No  Severe nausea/vomiting No  Taking medicines as instructed not applicable   OBJECTIVE:  BP (!) 130/92   LMP 04/17/2021   Appearance alert, well appearing, and in no distress and oriented to person, place, and time.  ASSESSMENT: Pregnancy [redacted]w[redacted]d  blood pressure check  PLAN: Discussed with Dr. Charlotta Newton   Recommendations:  induction    Follow-up:  for bp check 1 week after delivery    Zarielle Cea  01/08/2022 11:49 AM

## 2022-01-09 ENCOUNTER — Encounter (HOSPITAL_COMMUNITY): Payer: Self-pay | Admitting: Obstetrics & Gynecology

## 2022-01-09 ENCOUNTER — Inpatient Hospital Stay (HOSPITAL_COMMUNITY)
Admission: AD | Admit: 2022-01-09 | Discharge: 2022-01-11 | DRG: 807 | Disposition: A | Discharge status: Home / Self Care | Payer: Medicaid Other | Attending: Family Medicine | Admitting: Family Medicine

## 2022-01-09 ENCOUNTER — Inpatient Hospital Stay (HOSPITAL_COMMUNITY): Payer: Medicaid Other

## 2022-01-09 DIAGNOSIS — O24410 Gestational diabetes mellitus in pregnancy, diet controlled: Secondary | ICD-10-CM | POA: Diagnosis present

## 2022-01-09 DIAGNOSIS — O0993 Supervision of high risk pregnancy, unspecified, third trimester: Principal | ICD-10-CM

## 2022-01-09 DIAGNOSIS — Z3A38 38 weeks gestation of pregnancy: Secondary | ICD-10-CM | POA: Diagnosis not present

## 2022-01-09 DIAGNOSIS — O134 Gestational [pregnancy-induced] hypertension without significant proteinuria, complicating childbirth: Secondary | ICD-10-CM | POA: Diagnosis not present

## 2022-01-09 DIAGNOSIS — Z8279 Family history of other congenital malformations, deformations and chromosomal abnormalities: Secondary | ICD-10-CM | POA: Diagnosis present

## 2022-01-09 DIAGNOSIS — O139 Gestational [pregnancy-induced] hypertension without significant proteinuria, unspecified trimester: Secondary | ICD-10-CM

## 2022-01-09 DIAGNOSIS — Z8759 Personal history of other complications of pregnancy, childbirth and the puerperium: Secondary | ICD-10-CM | POA: Diagnosis present

## 2022-01-09 DIAGNOSIS — O358XX0 Maternal care for other (suspected) fetal abnormality and damage, not applicable or unspecified: Secondary | ICD-10-CM | POA: Diagnosis not present

## 2022-01-09 DIAGNOSIS — O24419 Gestational diabetes mellitus in pregnancy, unspecified control: Secondary | ICD-10-CM

## 2022-01-09 DIAGNOSIS — O24420 Gestational diabetes mellitus in childbirth, diet controlled: Secondary | ICD-10-CM | POA: Diagnosis not present

## 2022-01-09 LAB — CBC
HCT: 34.7 % — ABNORMAL LOW (ref 36.0–46.0)
HCT: 32.3 % — ABNORMAL LOW (ref 36.0–46.0)
Hemoglobin: 11.6 g/dL — ABNORMAL LOW (ref 12.0–15.0)
Hemoglobin: 10.9 g/dL — ABNORMAL LOW (ref 12.0–15.0)
MCH: 29 pg (ref 26.0–34.0)
MCH: 29.1 pg (ref 26.0–34.0)
MCHC: 33.4 g/dL (ref 30.0–36.0)
MCHC: 33.7 g/dL (ref 30.0–36.0)
MCV: 86.8 fL (ref 80.0–100.0)
MCV: 86.4 fL (ref 80.0–100.0)
Platelets: 155 10*3/uL (ref 150–400)
Platelets: 134 10*3/uL — ABNORMAL LOW (ref 150–400)
RBC: 4 MIL/uL (ref 3.87–5.11)
RBC: 3.74 MIL/uL — ABNORMAL LOW (ref 3.87–5.11)
RDW: 14 % (ref 11.5–15.5)
RDW: 13.7 % (ref 11.5–15.5)
WBC: 9.4 10*3/uL (ref 4.0–10.5)
WBC: 11.4 10*3/uL — ABNORMAL HIGH (ref 4.0–10.5)
nRBC: 0 % (ref 0.0–0.2)
nRBC: 0 % (ref 0.0–0.2)

## 2022-01-09 LAB — PROTEIN / CREATININE RATIO, URINE
Creatinine, Urine: 187 mg/dL
Protein Creatinine Ratio: 0.14 mg/mg{Cre} (ref 0.00–0.15)
Total Protein, Urine: 27 mg/dL

## 2022-01-09 LAB — COMPREHENSIVE METABOLIC PANEL
ALT: 13 U/L (ref 0–44)
AST: 22 U/L (ref 15–41)
Albumin: 3.2 g/dL — ABNORMAL LOW (ref 3.5–5.0)
Alkaline Phosphatase: 122 U/L (ref 38–126)
Anion gap: 10 (ref 5–15)
BUN: 7 mg/dL (ref 6–20)
CO2: 22 mmol/L (ref 22–32)
Calcium: 9.9 mg/dL (ref 8.9–10.3)
Chloride: 104 mmol/L (ref 98–111)
Creatinine, Ser: 0.56 mg/dL (ref 0.44–1.00)
GFR, Estimated: >60 mL/min (ref >60)
Glucose, Bld: 93 mg/dL (ref 70–99)
Potassium: 3.8 mmol/L (ref 3.5–5.1)
Sodium: 136 mmol/L (ref 135–145)
Total Bilirubin: 0.4 mg/dL (ref 0.3–1.2)
Total Protein: 6.8 g/dL (ref 6.5–8.1)

## 2022-01-09 LAB — TYPE AND SCREEN
ABO/RH(D): B POS
Antibody Screen: NEGATIVE

## 2022-01-09 LAB — GLUCOSE, CAPILLARY
Glucose-Capillary: 90 mg/dL (ref 70–99)
Glucose-Capillary: 99 mg/dL (ref 70–99)

## 2022-01-09 LAB — RPR: RPR Ser Ql: NONREACTIVE

## 2022-01-09 MED ORDER — COCONUT OIL OIL: 1.0000 | TOPICAL_OIL | Status: DC | PRN

## 2022-01-09 MED ORDER — EPHEDRINE 5 MG/ML INJ: 10.0000 mg | INTRAVENOUS | Status: DC | PRN

## 2022-01-09 MED ORDER — OXYCODONE-ACETAMINOPHEN 5-325 MG PO TABS: 1.0000 | ORAL_TABLET | ORAL | Status: DC | PRN

## 2022-01-09 MED ORDER — TERBUTALINE SULFATE 1 MG/ML IJ SOLN: 0.2500 mg | Freq: Once | INTRAMUSCULAR | Status: DC | PRN

## 2022-01-09 MED ORDER — ZOLPIDEM TARTRATE 5 MG PO TABS: 5.0000 mg | ORAL_TABLET | Freq: Every evening | ORAL | Status: DC | PRN

## 2022-01-09 MED ORDER — OXYTOCIN-SODIUM CHLORIDE 30-0.9 UT/500ML-% IV SOLN
2.5000 [IU]/h | INTRAVENOUS | Status: DC
  Filled 2022-01-09: qty 500

## 2022-01-09 MED ORDER — DIPHENHYDRAMINE HCL 50 MG/ML IJ SOLN: 12.5000 mg | INTRAMUSCULAR | Status: DC | PRN

## 2022-01-09 MED ORDER — SENNOSIDES-DOCUSATE SODIUM 8.6-50 MG PO TABS
2.0000 | ORAL_TABLET | ORAL | Status: DC
  Administered 2022-01-09 – 2022-01-10 (×2): 2 via ORAL
  Filled 2022-01-09 (×2): qty 2

## 2022-01-09 MED ORDER — MISOPROSTOL 25 MCG QUARTER TABLET: 25.0000 ug | ORAL_TABLET | Freq: Once | ORAL | Status: DC

## 2022-01-09 MED ORDER — OXYCODONE HCL 5 MG PO TABS: 10.0000 mg | ORAL_TABLET | ORAL | Status: DC | PRN

## 2022-01-09 MED ORDER — MISOPROSTOL 25 MCG QUARTER TABLET
25.0000 ug | ORAL_TABLET | Freq: Once | ORAL | Status: AC
  Administered 2022-01-09: 25 ug via VAGINAL
  Filled 2022-01-09: qty 1

## 2022-01-09 MED ORDER — SOD CITRATE-CITRIC ACID 500-334 MG/5ML PO SOLN
30.0000 mL | ORAL | Status: DC | PRN
Start: 2022-01-09 — End: 2022-01-09

## 2022-01-09 MED ORDER — BENZOCAINE-MENTHOL 20-0.5 % EX AERO: 1.0000 | INHALATION_SPRAY | CUTANEOUS | Status: DC | PRN

## 2022-01-09 MED ORDER — OXYCODONE-ACETAMINOPHEN 5-325 MG PO TABS: 2.0000 | ORAL_TABLET | ORAL | Status: DC | PRN

## 2022-01-09 MED ORDER — OXYCODONE HCL 5 MG PO TABS: 5.0000 mg | ORAL_TABLET | ORAL | Status: DC | PRN

## 2022-01-09 MED ORDER — PRENATAL MULTIVITAMIN CH
1.0000 | ORAL_TABLET | Freq: Every day | ORAL | Status: DC
  Administered 2022-01-11: 1 via ORAL
  Filled 2022-01-09: qty 1

## 2022-01-09 MED ORDER — OXYTOCIN BOLUS FROM INFUSION
333.0000 mL | Freq: Once | INTRAVENOUS | Status: AC
  Administered 2022-01-09: 333 mL via INTRAVENOUS

## 2022-01-09 MED ORDER — LIDOCAINE HCL (PF) 1 % IJ SOLN: 30.0000 mL | INTRAMUSCULAR | Status: DC | PRN

## 2022-01-09 MED ORDER — ACETAMINOPHEN 325 MG PO TABS
650.0000 mg | ORAL_TABLET | ORAL | Status: DC | PRN
Start: 2022-01-09 — End: 2022-01-09
  Administered 2022-01-09: 650 mg via ORAL
  Filled 2022-01-09: qty 2

## 2022-01-09 MED ORDER — LACTATED RINGERS IV SOLN: 500.0000 mL | Freq: Once | INTRAVENOUS | Status: DC

## 2022-01-09 MED ORDER — MISOPROSTOL 50MCG HALF TABLET
50.0000 ug | ORAL_TABLET | Freq: Once | ORAL | Status: AC
  Administered 2022-01-09: 50 ug via ORAL
  Filled 2022-01-09: qty 1

## 2022-01-09 MED ORDER — PHENYLEPHRINE 80 MCG/ML (10ML) SYRINGE FOR IV PUSH (FOR BLOOD PRESSURE SUPPORT): 80.0000 ug | PREFILLED_SYRINGE | INTRAVENOUS | Status: DC | PRN

## 2022-01-09 MED ORDER — ONDANSETRON HCL 4 MG PO TABS: 4.0000 mg | ORAL_TABLET | ORAL | Status: DC | PRN

## 2022-01-09 MED ORDER — FENTANYL CITRATE (PF) 100 MCG/2ML IJ SOLN
50.0000 ug | INTRAMUSCULAR | Status: DC | PRN
  Administered 2022-01-09 (×2): 100 ug via INTRAVENOUS
  Filled 2022-01-09 (×2): qty 2

## 2022-01-09 MED ORDER — LACTATED RINGERS IV SOLN: INTRAVENOUS | Status: DC

## 2022-01-09 MED ORDER — IBUPROFEN 600 MG PO TABS
600.0000 mg | ORAL_TABLET | Freq: Four times a day (QID) | ORAL | Status: DC
  Administered 2022-01-09 – 2022-01-11 (×7): 600 mg via ORAL
  Filled 2022-01-09 (×7): qty 1

## 2022-01-09 MED ORDER — ONDANSETRON HCL 4 MG/2ML IJ SOLN: 4.0000 mg | INTRAMUSCULAR | Status: DC | PRN

## 2022-01-09 MED ORDER — SIMETHICONE 80 MG PO CHEW: 80.0000 mg | CHEWABLE_TABLET | ORAL | Status: DC | PRN

## 2022-01-09 MED ORDER — WITCH HAZEL-GLYCERIN EX PADS: 1.0000 | MEDICATED_PAD | CUTANEOUS | Status: DC | PRN

## 2022-01-09 MED ORDER — MISOPROSTOL 50MCG HALF TABLET
50.0000 ug | ORAL_TABLET | ORAL | Status: DC | PRN
  Administered 2022-01-09: 50 ug via BUCCAL
  Filled 2022-01-09: qty 1

## 2022-01-09 MED ORDER — ACETAMINOPHEN 325 MG PO TABS: 650.0000 mg | ORAL_TABLET | ORAL | Status: DC | PRN

## 2022-01-09 MED ORDER — LACTATED RINGERS IV SOLN: 500.0000 mL | INTRAVENOUS | Status: DC | PRN

## 2022-01-09 MED ORDER — FENTANYL-BUPIVACAINE-NACL 0.5-0.125-0.9 MG/250ML-% EP SOLN: 12.0000 mL/h | EPIDURAL | Status: DC | PRN

## 2022-01-09 MED ORDER — ONDANSETRON HCL 4 MG/2ML IJ SOLN
4.0000 mg | Freq: Four times a day (QID) | INTRAMUSCULAR | Status: DC | PRN
Start: 2022-01-09 — End: 2022-01-09

## 2022-01-09 MED ORDER — DIBUCAINE (PERIANAL) 1 % EX OINT: 1.0000 | TOPICAL_OINTMENT | CUTANEOUS | Status: DC | PRN

## 2022-01-09 MED ORDER — TETANUS-DIPHTH-ACELL PERTUSSIS 5-2.5-18.5 LF-MCG/0.5 IM SUSY: 0.5000 mL | PREFILLED_SYRINGE | Freq: Once | INTRAMUSCULAR | Status: DC

## 2022-01-09 MED ORDER — DIPHENHYDRAMINE HCL 25 MG PO CAPS: 25.0000 mg | ORAL_CAPSULE | Freq: Four times a day (QID) | ORAL | Status: DC | PRN

## 2022-01-09 NOTE — Discharge Summary (Signed)
Postpartum Discharge Summary  Date of Service updated***     Patient Name: Suzanne Mack DOB: 1995/11/14 MRN: [id number]  Date of admission: 01/09/2022 Delivery date:01/09/2022  Delivering provider: Janice Norrie  Date of discharge: 01/09/2022  Admitting diagnosis: Gestational hypertension [O13.9] Intrauterine pregnancy: [redacted]w[redacted]d     Secondary diagnosis:  Active Problems:   * No active hospital problems. *  Additional problems: ***    Discharge diagnosis: Term Pregnancy Delivered, Gestational Hypertension, and GDM A1                                              Post partum procedures:{Postpartum procedures:23558} Augmentation: Cytotec Complications: None  Hospital course: Induction of Labor With Vaginal Delivery   26 y.o. yo G2P1001 at [redacted]w[redacted]d was admitted to the hospital 01/09/2022 for induction of labor.  Indication for induction: Gestational hypertension and A1 DM.  Patient had an uncomplicated labor course as follows: Membrane Rupture Time/Date: 12:24 PM ,01/09/2022   Delivery Method:Vaginal, Spontaneous  Episiotomy: None  Lacerations:  None  Details of delivery can be found in separate delivery note.  Patient had a routine postpartum course. Patient is discharged home 01/09/22.  Newborn Data: Birth date:01/09/2022  Birth time:12:35 PM  Gender:Female  Living status:Living  Apgars:8 ,9  Weight:   Magnesium Sulfate received: No BMZ received: No Rhophylac:N/A MMR:N/A T-DaP:{Tdap:23962} Flu: N/A Transfusion:{Transfusion received:30440034}  Physical exam  Vitals:   01/09/22 1041 01/09/22 1117 01/09/22 1243 01/09/22 1301  BP: (!) 150/100 (!) 141/88 120/67 129/82  Pulse: 75 68 91 83  Resp:  $Remo'18 18 16  'hCChe$ Temp:      TempSrc:      Weight:      Height:       General: {Exam; general:21111117} Lochia: {Desc; appropriate/inappropriate:30686::"appropriate"} Uterine Fundus: {Desc; firm/soft:30687} Incision: {Exam; incision:21111123} DVT Evaluation: {Exam;  dvt:2111122} Labs: Lab Results  Component Value Date   WBC 11.4 (H) 01/09/2022   HGB 10.9 (L) 01/09/2022   HCT 32.3 (L) 01/09/2022   MCV 86.4 01/09/2022   PLT 134 (L) 01/09/2022      Latest Ref Rng & Units 01/09/2022    1:10 AM  CMP  Glucose 70 - 99 mg/dL 93   BUN 6 - 20 mg/dL 7   Creatinine 0.44 - 1.00 mg/dL 0.56   Sodium 135 - 145 mmol/L 136   Potassium 3.5 - 5.1 mmol/L 3.8   Chloride 98 - 111 mmol/L 104   CO2 22 - 32 mmol/L 22   Calcium 8.9 - 10.3 mg/dL 9.9   Total Protein 6.5 - 8.1 g/dL 6.8   Total Bilirubin 0.3 - 1.2 mg/dL 0.4   Alkaline Phos 38 - 126 U/L 122   AST 15 - 41 U/L 22   ALT 0 - 44 U/L 13    Edinburgh Score:     No data to display           After visit meds:  Allergies as of 01/09/2022   No Known Allergies   Med Rec must be completed prior to using this [facility]***        Discharge home in stable condition Infant Feeding: {Baby feeding:23562} Infant Disposition:{CHL IP OB HOME WITH RNHAFB:90383} Discharge instruction: per After Visit Summary and Postpartum booklet. Activity: Advance as tolerated. Pelvic rest for 6 weeks.  Diet: {OB FXOV:29191660} Future Appointments:No future appointments. Follow up Visit:   Please schedule  this patient for a In person postpartum visit in 4 weeks with the following provider: Any provider. Additional Postpartum F/U:2 hour GTT and BP check 1 week  High risk pregnancy complicated by: GDM and HTN Delivery mode:  Vaginal, Spontaneous  Anticipated Birth Control:  Unsure

## 2022-01-09 NOTE — H&P (Signed)
OBSTETRIC ADMISSION HISTORY AND PHYSICAL  Suzanne Mack is a 26 y.o. female G2P1001 with IUP at [redacted]w[redacted]d by LMP presenting for gHTN and A1DM. She reports +FMs, No LOF, no VB, no blurry vision, headaches or peripheral edema, and RUQ pain.  She plans on breast feeding. She request nothing for birth control. She received her prenatal care at [facility]   Dating: By LMP --->  Estimated Date of Delivery: 01/22/22  Sono:   '@35w'$ , CWD, normal anatomy, vertex presentation,2356 g, 23% EFW Transposition of great arteries  Prenatal History/Complications:   Term SVD X1 w/o problems   FAMILY TREE  RESULTS  Language English Pap 05/15/21 neg  Initiated care at 12wks GC/CT Initial:  -/-          36wks:-/-  Dating by LMP c/w 8wk [place]    Support person  Genetics NT/IT: normal NT, declined 2nd IT    AFP:      Panorama: declined  BP cuff Order given 11/26/2021 Carrier Screen declined    [place]/Hgb Elec   Rhogam n/a    TDaP vaccine Declined 11/26/2021 Blood Type B/Positive/-- (02/27 1535)  Flu vaccine  Antibody Negative (02/27 1535)  Covid vaccine  HBsAg Negative (02/27 1535)    RPR Non Reactive (02/27 1535)  Anatomy US Boy, abnl aortic arch-newborn echo Rubella  1.41 (02/27 1535)  Feeding Plan breast HIV Non Reactive (02/27 1535)  Contraception none Hep C neg  Circumcision yes    Pediatrician [facility] A1C/GTT Early: 5.4     26-28wks: glucola 204  Prenatal Classes       GBS   neg  $Re'[ ]'ZwU$  PCN allergy  BTL Consent     VBAC Consent n/a PHQ9 & GAD7  $Rem'[ ]'SVjI$ New OB  $R'[ ]'BC$ 28wks   '[ ]'$ 36wks  Waterbirth $RemoveBef'[ ]'qLWfUBzUFF$ Class $RemoveBef'[ ]'AIiNdQaXaK$  36wkCNM visit/consent      Past Medical History: Past Medical History:  Diagnosis Date   Gestational diabetes 2019    Past Surgical History: Past Surgical History:  Procedure Laterality Date   NO PAST SURGERIES      Obstetrical History: OB History     Gravida  2   Para  1   Term  1   Preterm  0   AB  0   Living  1      SAB  0   IAB  0   Ectopic  0   Multiple      Live Births   1           Social History Social History   Socioeconomic History   Marital status: Single    Spouse name: Not on file   Number of children: Not on file   Years of education: Not on file   Highest education level: Not on file  Occupational History   Not on file  Tobacco Use   Smoking status: Never   Smokeless tobacco: Never  Vaping Use   Vaping Use: Never used  Substance and Sexual Activity   Alcohol use: Not Currently    Comment: social   Drug use: Never   Sexual activity: Yes    Birth control/protection: None  Other Topics Concern   Not on file  Social History Narrative   ** Merged History Encounter **       Social Determinants of Health   Financial Resource Strain: Low Risk  (11/04/2021)   Overall Financial Resource Strain (CARDIA)    Difficulty of Paying Living Expenses: Not very hard  Food Insecurity: No  Food Insecurity (11/04/2021)   Hunger Vital Sign    Worried About Running Out of Food in the Last Year: Never true    Ran Out of Food in the Last Year: Never true  Transportation Needs: No Transportation Needs (11/04/2021)   PRAPARE - Hydrologist (Medical): No    Lack of Transportation (Non-Medical): No  Physical Activity: Sufficiently Active (11/04/2021)   Exercise Vital Sign    Days of Exercise per Week: 5 days    Minutes of Exercise per Session: 60 min  Stress: No Stress Concern Present (11/04/2021)   [place]    Feeling of Stress : Not at all  Social Connections: Socially Isolated (11/04/2021)   Social Connection and Isolation Panel [NHANES]    Frequency of Communication with Friends and Family: More than three times a week    Frequency of Social Gatherings with Friends and Family: More than three times a week    Attends Religious Services: Never    Marine scientist or Organizations: No    Attends Music therapist: Never    Marital  Status: Never married    Family History: Family History  Problem Relation Age of Onset   Diabetes Mother    Asthma Neg Hx    Birth defects Neg Hx    Cancer Neg Hx    Heart disease Neg Hx    Hypertension Neg Hx    Stroke Neg Hx     Allergies: No Known Allergies  Medications Prior to Admission  Medication Sig Dispense Refill Last Dose   Accu-Chek Softclix Lancets lancets Use as instructed to check blood sugar 4 times daily 100 each 12    Blood Glucose Monitoring Suppl (ACCU-CHEK GUIDE ME) w/Device KIT 1 each by Does not apply route 4 (four) times daily. 1 kit 0    Blood Pressure Monitor MISC For regular home bp monitoring during pregnancy 1 each 0    glucose blood (ACCU-CHEK GUIDE) test strip Use as instructed to check blood sugar 4 times daily 50 each 12    Pediatric Multiple Vitamins (FLINSTONES GUMMIES OMEGA-3 DHA PO) Take by mouth. Takes 2 daily        Review of Systems   All systems reviewed and negative except as stated in HPI  Blood pressure (!) 154/99, pulse 99, temperature 99 F (37.2 C), temperature source Oral, height $RemoveBefo'5\' 3"'OlljwKsGvgY$  (1.6 m), weight 82.3 kg, last menstrual period 04/17/2021. General appearance: alert, cooperative, and no distress Lungs: clear to auscultation bilaterally Heart: regular rate and rhythm Abdomen: soft, non-tender; bowel sounds normal  Extremities: Homans sign is negative, no sign of DVT DTR's 2+ Presentation: cephalic Fetal monitoringBaseline: 130 bpm, Variability: Good {> 6 bpm), Accelerations: Reactive, and Decelerations: Absent Uterine activityNone     Prenatal labs: ABO, Rh: B/Positive/-- (02/27 1535) Antibody: Negative (06/20 1101) Rubella: 1.41 (02/27 1535) RPR: Non Reactive (06/20 1101)  HBsAg: Negative (02/27 1535)  HIV: Non Reactive (06/20 1101)  GBS: Negative/-- (08/09 1530)  2 hr Glucola 204 Genetic screening  NEG Anatomy US Fetal cardiac anomaly  Prenatal Transfer Tool  Maternal Diabetes: Yes:  Diabetes Type:  Diet  controlled Genetic Screening: Normal Maternal Ultrasounds/Referrals: Fetal Heart Anomalies Fetal Ultrasounds or other Referrals:  Fetal echo Maternal Substance Abuse:  No Significant Maternal Medications:  None Significant Maternal Lab Results: Group B Strep negative  Results for orders placed or performed in visit on 01/08/22 (from the past 24  hour(s))  POC Urinalysis Dipstick OB   Collection Time: 01/08/22 11:46 AM  Result Value Ref Range   Color, UA     Clarity, UA     Glucose, UA Negative Negative   Bilirubin, UA     Ketones, UA neg    Spec Grav, UA     Blood, UA neg    pH, UA     POC,PROTEIN,UA Trace Negative, Trace, Small (1+), Moderate (2+), Large (3+), 4+   Urobilinogen, UA     Nitrite, UA neg    Leukocytes, UA Negative Negative   Appearance     Odor      Patient Active Problem List   Diagnosis Date Noted   Gestational diabetes mellitus, class A1 11/06/2021   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 G2P1001 at [redacted]w[redacted]d here forIOL gHTN and GDM  #Labor:cytotec  pt declines foley #Pain: epidrual #FWB: Cat 1 #ID:  GBS neg #MOF: breast #MOC:none #Circ:  yes  Christin Fudge, CNM

## 2022-01-09 NOTE — Lactation Note (Signed)
This note was copied from a baby's chart. Lactation Consultation Note  Patient Name: Suzanne Mack [id number] Date: 01/09/2022 Reason for consult: L&D Initial assessment;Early term 37-38.6wks Age:27 hours  L&D consult. Several attempts to latch unsuccessful. Assisted with hand expression and spoonfed. Newborn placed back to skin to skin.   Discussed STS as ideal transition for infants after birth. Explained LC services availability during postpartum stay. Thanked family for their time.    Manual pump for nipple eversion recommended. May need a nipple shield.   Maternal Data Has patient been taught Hand Expression?: Yes Does the patient have breastfeeding experience prior to this delivery?: Yes How long did the patient breastfeed?: 2 months  Feeding Mother's Current Feeding Choice: Breast Milk  LATCH Score Latch: Repeated attempts needed to sustain latch, nipple held in mouth throughout feeding, stimulation needed to elicit sucking reflex.  Audible Swallowing: None  Type of Nipple: Everted at rest and after stimulation (very short shafted)  Comfort (Breast/Nipple): Soft / non-tender  Hold (Positioning): Assistance needed to correctly position infant at breast and maintain latch.  LATCH Score: 6  Interventions Interventions: Education;Assisted with latch;Skin to skin;Hand express;Expressed milk  Discharge Pump: Personal  Consult Status Consult Status: Follow-up from L&D Date: 01/09/22 Follow-up type: In-patient    Suzanne Mack A Higuera Ancidey 01/09/2022, 1:35 PM

## 2022-01-09 NOTE — Progress Notes (Addendum)
Suzanne Mack is a 26 y.o. G2P1001 at [redacted]w[redacted]d admitted for induction of labor due to Gestational diabetes and Hypertension.  Subjective: Feeling more pain with contractions, bloody show  Objective: BP (!) 144/96   Pulse 77   Temp 97.6 F (36.4 C) (Oral)   Resp 16   Ht 5\' 3"  (1.6 m)   Wt 82.3 kg   LMP 04/17/2021   BMI 32.15 kg/m  No intake/output data recorded. No intake/output data recorded.  FHT:  FHR: 120 bpm, variability: moderate,  accelerations:  Present,  decelerations:  Absent UC:   regular, every 2-3 minutes SVE:   Dilation: 5 Effacement (%): 70 Station: -1 Exam by:: Resident with [email or web address], cnm  Labs: Lab Results  Component Value Date   WBC 9.4 01/09/2022   HGB 11.6 (L) 01/09/2022   HCT 34.7 (L) 01/09/2022   MCV 86.8 01/09/2022   PLT 155 01/09/2022    Assessment / Plan: Induction of labor due to gestational diabetes,  progressing well on pitocin  Labor: Progressing normally Preeclampsia:   n/a Fetal Wellbeing:  Category I Pain Control:  Labor support without medications I/D:  n/a Anticipated MOD:   NSVD  01/11/2022, MD 01/09/2022, 10:14 AM   Attestation of Supervision of Resident:  I confirm that I have verified the information documented in the  resident's  note and that I have also personally reperformed the history, physical exam and all medical decision making activities.  I have verified that all services and findings are accurately documented in this student's note; and I agree with management and plan as outlined in the documentation. I have also made any necessary editorial changes.  01/11/2022 DNP, CNM  01/09/22  11:09 AM

## 2022-01-09 NOTE — Progress Notes (Signed)
Labor Progress Note Suzanne Mack is a 26 y.o. G2P1001 at [redacted]w[redacted]d presented for IOL gHTN and GDMA1 S: Doing well  O:  BP (!) 148/92   Pulse 75   Temp 99 F (37.2 C) (Oral)   Resp 16   Ht 5\' 3"  (1.6 m)   Wt 82.3 kg   LMP 04/17/2021   BMI 32.15 kg/m  EFM: 110bpm/Moderate variability/ 15x15 accels/ None decels  CBG (last 3)  Recent Labs    01/09/22 0608  GLUCAP 90      CVE: Dilation: 2.5 Effacement (%): 50 Cervical Position: Posterior Station: 0609, -3 Presentation: Vertex Exam by:: [email or web address], RN   A&P: 26 y.o. G2P1001 [redacted]w[redacted]d gHTN and GDMA1 #Labor: Progressing well. Additional dose of Cytotec #Pain:  #FWB: Cat 1 #GBS negative #gHTN: Asx. Labs WNL. Monitor pressures #GDMA1: Sugars normal. Q4 hours in latent labor and Q2hours in active labor  [redacted]w[redacted]d, MD 6:25 AM

## 2022-01-09 NOTE — Lactation Note (Signed)
This note was copied from a baby's chart. Lactation Consultation Note  Patient Name: Suzanne Mack [id number] Date: 01/09/2022 Reason for consult: Initial assessment;Mother's request;Early term 37-38.6wks;Breastfeeding assistance;Maternal endocrine disorder Age:27 hours  LC not able to see a latch, infant had recent feeding prior to arrival.  Birth parent feeding plan to EBF.   Plan 1. To feed based on cues 8-12x 24hr period. Birth parent to latch and observe signs of milk transfer.  2. If unable to latch, hand express and offer colostrum with spoon then try a latch.  All questions answered at the end of the visit.  Birth parent aware to call RN or [facility] for latch assistance for next feeding.   Maternal Data Has patient been taught Hand Expression?: Yes Does the patient have breastfeeding experience prior to this delivery?: Yes How long did the patient breastfeed?: 2 months  Feeding Mother's Current Feeding Choice: Breast Milk  LATCH Score                    Lactation Tools Discussed/Used    Interventions Interventions: Breast feeding basics reviewed;Skin to skin;Hand express;Position options;Expressed milk;Education;Scientist, research (physical sciences)  Discharge WIC Program: Yes  Consult Status Consult Status: Follow-up Date: 01/10/22 Follow-up type: In-patient    Suzanne Mack  Suzanne Mack 01/09/2022, 9:27 PM

## 2022-01-10 MED ORDER — NIFEDIPINE ER OSMOTIC RELEASE 30 MG PO TB24
30.0000 mg | ORAL_TABLET | Freq: Every day | ORAL | Status: DC
  Administered 2022-01-10 – 2022-01-11 (×2): 30 mg via ORAL
  Filled 2022-01-10 (×2): qty 1

## 2022-01-10 NOTE — Progress Notes (Signed)
POSTPARTUM PROGRESS NOTE  Subjective: Suzanne Mack is a 26 y.o. [id number] s/p SVD at [redacted]w[redacted]d.  She reports she is doing well. No acute events overnight. She denies any problems with ambulating, voiding or po intake. Denies nausea or vomiting. Pain is well controlled.  Lochia is Minimal.   Patient denies +/- headache, scotomata, and RUQ pain.   Objective: BP 113/79 (BP Location: Right Arm)   Pulse 79   Temp 98.2 F (36.8 C) (Oral)   Resp 18   Ht 5\' 3"  (1.6 m)   Wt 82.3 kg   LMP 04/17/2021   SpO2 100%   Breastfeeding Unknown   BMI 32.15 kg/m   Physical Exam:  General: alert, cooperative and no distress Chest: CTAB, no respiratory distress Abdomen: soft, non-tender  Uterine Fundus: firm, appropriately tender Extremities: No calf swelling, tenderness, or edema  Recent Labs    01/09/22 0110 01/09/22 1019  HGB 11.6* 10.9*  HCT 34.7* 32.3*    Assessment/Plan: Britta Louth is a 26 y.o. 34 s/p IVD at [redacted]w[redacted]d for gHTN and  GDM.   LOS: 1 day   PPD#1: Doing well, pain well-controlled.  -- Routine postpartum care, lactation support -- GHTN - Procardia XL 30mg  daily -- Encouraged up OOB -- Contraception: no method  -- Feeding: breast feeding -- Circumcision: yes  Dispo: Plan for discharge likely 8/26, pending baby heart echo.  , MD, PGY-1 [facility] 5:14 PM 01/10/2022

## 2022-01-11 DIAGNOSIS — O139 Gestational [pregnancy-induced] hypertension without significant proteinuria, unspecified trimester: Secondary | ICD-10-CM | POA: Diagnosis present

## 2022-01-11 DIAGNOSIS — Z8759 Personal history of other complications of pregnancy, childbirth and the puerperium: Secondary | ICD-10-CM | POA: Diagnosis present

## 2022-01-11 MED ORDER — FUROSEMIDE 20 MG PO TABS
20.0000 mg | ORAL_TABLET | Freq: Every day | ORAL | Status: DC
  Administered 2022-01-11: 20 mg via ORAL
  Filled 2022-01-11: qty 1

## 2022-01-11 MED ORDER — FUROSEMIDE 20 MG PO TABS
20.0000 mg | ORAL_TABLET | Freq: Every day | ORAL | 0 refills | Status: DC
Start: 2022-01-12 — End: 2022-01-20

## 2022-01-11 MED ORDER — ACETAMINOPHEN 325 MG PO TABS: 650.0000 mg | ORAL_TABLET | ORAL | 0 refills | Status: AC | PRN

## 2022-01-11 MED ORDER — IBUPROFEN 600 MG PO TABS: 600.0000 mg | ORAL_TABLET | Freq: Four times a day (QID) | ORAL | 0 refills | Status: DC

## 2022-01-11 MED ORDER — WITCH HAZEL-GLYCERIN EX PADS
1.0000 | MEDICATED_PAD | CUTANEOUS | 12 refills | Status: DC | PRN
Start: 2022-01-11 — End: 2022-12-21

## 2022-01-11 MED ORDER — DIBUCAINE (PERIANAL) 1 % EX OINT: 1.0000 | TOPICAL_OINTMENT | CUTANEOUS | 0 refills | Status: AC | PRN

## 2022-01-11 NOTE — Lactation Note (Signed)
This note was copied from a baby's chart. Lactation Consultation Note  Patient Name: Boy Joud Pettinato [id number] Date: 01/11/2022   Age:27 hours  LC Note:  Per RN, birth parent declined lactation services.    Maternal Data    Feeding Nipple Type: Slow - flow  LATCH Score Latch: Repeated attempts needed to sustain latch, nipple held in mouth throughout feeding, stimulation needed to elicit sucking reflex. (baby opens wide, several strong sucks, then falls asleep at left breast. right breast, baby vigorous, crying and woundn't settle to latch and suck. Mom states she has a double electric pump at home, encourged her to offer the breast with feeding cues.)  Audible Swallowing: A few with stimulation  Type of Nipple: Everted at rest and after stimulation (left nipple bruising & sore.)  Comfort (Breast/Nipple): Filling, red/small blisters or bruises, mild/mod discomfort  Hold (Positioning): Assistance needed to correctly position infant at breast and maintain latch.  LATCH Score: 6   Lactation Tools Discussed/Used    Interventions Interventions: Breast feeding basics reviewed;Assisted with latch;Skin to skin;Breast massage;Hand express;Support pillows;Adjust position;Expressed milk  Discharge    Consult Status Consult Status: Complete (mother declined follow up) Date: 01/11/22 Follow-up type: Call as needed    Caster Fayette R Jaxten Brosh 01/11/2022, 10:00 AM

## 2022-01-11 NOTE — Plan of Care (Signed)
Discharge teaching with after visit summary given, pt receptive.  

## 2022-01-13 ENCOUNTER — Encounter: Payer: Medicaid Other | Admitting: Obstetrics & Gynecology

## 2022-01-13 ENCOUNTER — Other Ambulatory Visit: Payer: Medicaid Other

## 2022-01-14 DIAGNOSIS — Z6830 Body mass index (BMI) 30.0-30.9, adult: Secondary | ICD-10-CM | POA: Diagnosis not present

## 2022-01-14 DIAGNOSIS — R2232 Localized swelling, mass and lump, left upper limb: Secondary | ICD-10-CM | POA: Diagnosis not present

## 2022-01-15 ENCOUNTER — Encounter: Payer: Medicaid Other | Admitting: Obstetrics & Gynecology

## 2022-01-15 ENCOUNTER — Encounter: Payer: Self-pay | Admitting: *Deleted

## 2022-01-15 ENCOUNTER — Other Ambulatory Visit: Payer: Medicaid Other

## 2022-01-16 DIAGNOSIS — Z419 Encounter for procedure for purposes other than remedying health state, unspecified: Secondary | ICD-10-CM | POA: Diagnosis not present

## 2022-01-20 ENCOUNTER — Ambulatory Visit (INDEPENDENT_AMBULATORY_CARE_PROVIDER_SITE_OTHER): Payer: Medicaid Other | Admitting: *Deleted

## 2022-01-20 ENCOUNTER — Other Ambulatory Visit: Payer: Medicaid Other

## 2022-01-20 ENCOUNTER — Ambulatory Visit: Payer: Medicaid Other

## 2022-01-20 VITALS — BP 127/88 | HR 100

## 2022-01-20 DIAGNOSIS — Z0130 Encounter for examination of blood pressure without abnormal findings: Secondary | ICD-10-CM

## 2022-01-20 LAB — COMPREHENSIVE METABOLIC PANEL
ALT: 8 IU/L (ref 0–32)
AST: 12 IU/L (ref 0–40)
Albumin/Globulin Ratio: 1.3 (ref 1.2–2.2)
Albumin: 3.6 g/dL — ABNORMAL LOW (ref 4.0–5.0)
Alkaline Phosphatase: 133 IU/L — ABNORMAL HIGH (ref 44–121)
BUN/Creatinine Ratio: 15 (ref 9–23)
BUN: 8 mg/dL (ref 6–20)
Bilirubin Total: <0.2 mg/dL (ref 0.0–1.2)
CO2: 20 mmol/L (ref 20–29)
Calcium: 8.9 mg/dL (ref 8.7–10.2)
Chloride: 101 mmol/L (ref 96–106)
Creatinine, Ser: 0.55 mg/dL — ABNORMAL LOW (ref 0.57–1.00)
Globulin, Total: 2.8 g/dL (ref 1.5–4.5)
Glucose: 100 mg/dL — ABNORMAL HIGH (ref 70–99)
Potassium: 3.9 mmol/L (ref 3.5–5.2)
Sodium: 135 mmol/L (ref 134–144)
Total Protein: 6.4 g/dL (ref 6.0–8.5)
eGFR: 129 mL/min/{1.73_m2} (ref >59)

## 2022-01-20 LAB — CBC
Hematocrit: 35.3 % (ref 34.0–46.6)
Hemoglobin: 11.7 g/dL (ref 11.1–15.9)
MCH: 29.5 pg (ref 26.6–33.0)
MCHC: 33.1 g/dL (ref 31.5–35.7)
MCV: 89 fL (ref 79–97)
Platelets: 139 10*3/uL — ABNORMAL LOW (ref 150–450)
RBC: 3.97 x10E6/uL (ref 3.77–5.28)
RDW: 13.6 % (ref 11.7–15.4)
WBC: 9.8 10*3/uL (ref 3.4–10.8)

## 2022-01-20 LAB — PROTEIN / CREATININE RATIO, URINE

## 2022-01-20 NOTE — Progress Notes (Signed)
   NURSE VISIT- BLOOD PRESSURE CHECK  SUBJECTIVE:  Suzanne Mack is a 26 y.o. G99P2002 female here for BP check. She is postpartum, delivery date 01/09/22     HYPERTENSION ROS:  Pregnant/postpartum:  Severe headaches that don't go away with tylenol/other medicines: No  Visual changes (seeing spots/double/blurred vision) No  Severe pain under right breast breast or in center of upper chest No  Severe nausea/vomiting No  Taking medicines as instructed not applicable  OBJECTIVE:  BP (!) 128/93 (BP Location: Right Arm, Patient Position: Sitting, Cuff Size: Normal)   Pulse (!) 107   Breastfeeding Yes   Appearance alert, well appearing, and in no distress.  ASSESSMENT: Postpartum  blood pressure check  PLAN: Discussed with Dr. Alysia Penna   Recommendations: no changes needed   Follow-up: as scheduled   Annamarie Dawley  01/20/2022 1:54 PM

## 2022-01-21 ENCOUNTER — Encounter: Payer: Medicaid Other | Admitting: Obstetrics & Gynecology

## 2022-01-21 DIAGNOSIS — Z0389 Encounter for observation for other suspected diseases and conditions ruled out: Secondary | ICD-10-CM | POA: Diagnosis not present

## 2022-01-21 DIAGNOSIS — R2232 Localized swelling, mass and lump, left upper limb: Secondary | ICD-10-CM | POA: Diagnosis not present

## 2022-02-02 DIAGNOSIS — O0991 Supervision of high risk pregnancy, unspecified, first trimester: Secondary | ICD-10-CM | POA: Diagnosis not present

## 2022-02-10 ENCOUNTER — Ambulatory Visit (INDEPENDENT_AMBULATORY_CARE_PROVIDER_SITE_OTHER): Payer: Medicaid Other | Admitting: Women's Health

## 2022-02-10 ENCOUNTER — Other Ambulatory Visit: Payer: Medicaid Other

## 2022-02-10 ENCOUNTER — Encounter: Payer: Self-pay | Admitting: Women's Health

## 2022-02-10 DIAGNOSIS — Z8759 Personal history of other complications of pregnancy, childbirth and the puerperium: Secondary | ICD-10-CM

## 2022-02-10 DIAGNOSIS — Z131 Encounter for screening for diabetes mellitus: Secondary | ICD-10-CM | POA: Diagnosis not present

## 2022-02-10 DIAGNOSIS — Z8632 Personal history of gestational diabetes: Secondary | ICD-10-CM

## 2022-02-10 DIAGNOSIS — O24410 Gestational diabetes mellitus in pregnancy, diet controlled: Secondary | ICD-10-CM

## 2022-02-10 NOTE — Progress Notes (Signed)
POSTPARTUM VISIT Patient name: Suzanne Mack MRN [id number]  Date of birth: Dec 22, 1995 Chief Complaint:   Postpartum Care  History of Present Illness:   Suzanne Mack is a 26 y.o. G39P2002 Caucasian female being seen today for a postpartum visit. She is 4 weeks postpartum following a spontaneous vaginal delivery at 38.1 gestational weeks. IOL: yes, for diabetes mellitus A1DM and gestational hypertension . Anesthesia: none.  Laceration: none.  Complications: none. Inpatient contraception: no.   Pregnancy complicated by GHTN, X9KW, fetal transposition of great vessels . Tobacco use: no. Substance use disorder: no. Last pap smear: 05/15/21 and results were NILM w/ HRHPV negative. Next pap smear due: 2025 Patient's last menstrual period was 04/17/2021 (approximate).  Postpartum course has been complicated by HTN, d/c'd on lasix and procardia . States she only took the lasix. Bleeding none. Bowel function is normal. Bladder function is normal. Urinary incontinence? no, fecal incontinence? no Patient is not sexually active. Last sexual activity: prior to birth of baby. Desired contraception:  abstinence, does not have partner . Patient does not know want a pregnancy in the future.  Desired family size is uncertain #of children.   Upstream - 02/10/22 1054       Pregnancy Intention Screening   Does the patient want to become pregnant in the next year? No    Does the patient's partner want to become pregnant in the next year? No    Would the patient like to discuss contraceptive options today? No      Contraception Wrap Up   Current Method Abstinence    End Method Abstinence            The pregnancy intention screening data noted above was reviewed. Potential methods of contraception were discussed. The patient elected to proceed with Abstinence.  Edinburgh Postpartum Depression Screening: negative  Edinburgh Postnatal Depression Scale - 02/10/22 1050       Edinburgh Postnatal  Depression Scale:  In the Past 7 Days   I have been able to laugh and see the funny side of things. 0    I have looked forward with enjoyment to things. 0    I have blamed myself unnecessarily when things went wrong. 0    I have been anxious or worried for no good reason. 0    I have felt scared or panicky for no good reason. 0    Things have been getting on top of me. 0    I have been so unhappy that I have had difficulty sleeping. 0    I have felt sad or miserable. 0    I have been so unhappy that I have been crying. 0    The thought of harming myself has occurred to me. 0    Edinburgh Postnatal Depression Scale Total 0                11/04/2021   10:08 AM 07/14/2021    2:33 PM 05/15/2021    1:32 PM  GAD 7 : Generalized Anxiety Score  Nervous, Anxious, on Edge 0 1 0  Control/stop worrying 0 0 0  Worry too much - different things 0 0 0  Trouble relaxing 0 1 0  Restless 0 0 0  Easily annoyed or irritable 0 2 2  Afraid - awful might happen 0 1 1  Total GAD 7 Score 0 5 3     Baby's course has been complicated by transposition of great vessels, states testing at hospital looked good, and  did not require further f/u . Baby is feeding by breast and bottle: milk supply adequate. Infant has a pediatrician/family doctor? Yes.  Childcare strategy if returning to work/school:  did not discuss .  Pt has material needs met for her and baby: Yes.   Review of Systems:   Pertinent items are noted in HPI Denies Abnormal vaginal discharge w/ itching/odor/irritation, headaches, visual changes, shortness of breath, chest pain, abdominal pain, severe nausea/vomiting, or problems with urination or bowel movements. Pertinent History Reviewed:  Reviewed past medical,surgical, obstetrical and family history.  Reviewed problem list, medications and allergies. OB History  Gravida Para Term Preterm AB Living  $Remov'2 2 2 'wyNYHP$ 0 0 2  SAB IAB Ectopic Multiple Live Births  0 0 0 0 2    # Outcome Date GA Lbr  Len/2nd Weight Sex Delivery Anes PTL Lv  2 Term 01/09/22 [redacted]w[redacted]d 05:25 / 00:10 6 lb 4.9 oz (2.86 kg) M Vag-Spont None  LIV  1 Term  [redacted]w[redacted]d  6 lb 11 oz (3.033 kg) F Vag-Spont EPI N LIV   Physical Assessment:   Vitals:   02/10/22 1045  BP: 127/89  Pulse: 80  Weight: 166 lb 6 oz (75.5 kg)  Height: $Remove'5\' 3"'qxfMohX$  (1.6 m)  Body mass index is 29.47 kg/m.       Physical Examination:   General appearance: alert, well appearing, and in no distress  Mental status: alert, oriented to person, place, and time  Skin: warm & dry   Cardiovascular: normal heart rate noted   Respiratory: normal respiratory effort, no distress   Breasts: deferred, no complaints   Abdomen: soft, non-tender   Pelvic: examination not indicated. Thin prep pap obtained: No  Rectal: not examined  Extremities: Edema: none   Chaperone: N/A         No results found for this or any previous visit (from the past 24 hour(s)).  Assessment & Plan:  1) Postpartum exam 2) 4 wks s/p spontaneous vaginal delivery after IOL for GHTN and A1DM 3) breast & bottle feeding 4) Depression screening 5) Contraception counseling>abstinence, no partner, will let us know if changes mind 6) H/O GHTN> bp wnl off meds 7) H/O A1DM> declines 2hr GTT, plan A1C 12wks pp  Essential components of care per ACOG recommendations:  1.  Mood and well being:  If positive depression screen, discussed and plan developed.  If using tobacco we discussed reduction/cessation and risk of relapse If current substance abuse, we discussed and referral to local resources was offered.   2. Infant care and feeding:  If breastfeeding, discussed returning to work, pumping, breastfeeding-associated pain, guidance regarding return to fertility while lactating if not using another method. If needed, patient was provided with a letter to be allowed to pump q 2-3hrs to support lactation in a private location with access to a refrigerator to store breastmilk.   Recommended that all  caregivers be immunized for flu, pertussis and other preventable communicable diseases If pt does not have material needs met for her/baby, referred to local resources for help obtaining these.  3. Sexuality, contraception and birth spacing Provided guidance regarding sexuality, management of dyspareunia, and resumption of intercourse Discussed avoiding interpregnancy interval <101mths and recommended birth spacing of 18 months  4. Sleep and fatigue Discussed coping options for fatigue and sleep disruption Encouraged family/partner/community support of 4 hrs of uninterrupted sleep to help with mood and fatigue  5. Physical recovery  If pt had a C/S, assessed incisional pain and providing guidance on  normal vs prolonged recovery If pt had a laceration, perineal healing and pain reviewed.  If urinary or fecal incontinence, discussed management and referred to PT or uro/gyn if indicated  Patient is safe to resume physical activity. Discussed attainment of healthy weight.  6.  Chronic disease management Discussed pregnancy complications if any, and their implications for future childbearing and long-term maternal health. Review recommendations for prevention of recurrent pregnancy complications, such as 17 hydroxyprogesterone caproate to reduce risk for recurrent PTB not applicable, or aspirin to reduce risk of preeclampsia yes. Pt had GDM: yes. If yes, 2hr GTT scheduled: 12wk A1C per pt request. Reviewed medications and non-pregnant dosing including consideration of whether pt is breastfeeding using a reliable resource such as LactMed: not applicable Referred for f/u w/ PCP or subspecialist providers as indicated: not applicable  7. Health maintenance Mammogram at 27yo or earlier if indicated Pap smears as indicated  Meds: No orders of the defined types were placed in this encounter.   Follow-up: Return for 8wks lab only; 51yr physical.   Orders Placed This Encounter  Procedures    Hemoglobin A1c    Roma Schanz CNM, [facility] 02/10/2022 11:13 AM

## 2022-02-15 DIAGNOSIS — Z419 Encounter for procedure for purposes other than remedying health state, unspecified: Secondary | ICD-10-CM | POA: Diagnosis not present

## 2022-03-18 DIAGNOSIS — Z419 Encounter for procedure for purposes other than remedying health state, unspecified: Secondary | ICD-10-CM | POA: Diagnosis not present

## 2022-04-07 ENCOUNTER — Other Ambulatory Visit: Payer: Medicaid Other

## 2022-04-17 DIAGNOSIS — Z419 Encounter for procedure for purposes other than remedying health state, unspecified: Secondary | ICD-10-CM | POA: Diagnosis not present

## 2022-05-18 DIAGNOSIS — Z419 Encounter for procedure for purposes other than remedying health state, unspecified: Secondary | ICD-10-CM | POA: Diagnosis not present

## 2022-06-09 NOTE — Congregational Nurse Program (Signed)
Patient presented to onsite [place] for return visit for family of 3 to shop for 22.35

## 2022-06-18 DIAGNOSIS — Z419 Encounter for procedure for purposes other than remedying health state, unspecified: Secondary | ICD-10-CM | POA: Diagnosis not present

## 2022-07-17 DIAGNOSIS — Z419 Encounter for procedure for purposes other than remedying health state, unspecified: Secondary | ICD-10-CM | POA: Diagnosis not present

## 2022-07-21 NOTE — Congregational Nurse Program (Signed)
Patient presented to onsite [place] for return visit and received 19.95 lbs of food for family of 3

## 2022-07-21 NOTE — Congregational Nurse Program (Signed)
  Dept: [phone]   Congregational Nurse Program Note  Date of Encounter: 07/21/2022  Past Medical History: Past Medical History:  Diagnosis Date   Gestational diabetes 2019    Encounter Details:  CNP Questionnaire - 07/21/22 1409       Questionnaire   Ask client: Do you give verbal consent for me to treat you today? N/A    Student Assistance N/A    Location Patient [place]    Visit Setting with Client Organization    Patient Status Unknown    Insurance Unknown    Insurance/Financial Assistance Referral N/A    Medication N/A    Medical Provider Yes    Screening Referrals Made N/A    Medical Referrals Made N/A    Medical Appointment Made N/A    Recently w/o PCP, now 1st time PCP visit completed due to CNs referral or appointment made N/A    Food Have Food Insecurities;Referred to Food Pantry    Transportation N/A    Housing/Utilities N/A    Interpersonal Safety N/A    Interventions Advocate/Support    Abnormal to Normal Screening Since Last CN Visit N/A    Screenings CN Performed N/A    Sent Client to Lab for: N/A    Did client attend any of the following based off CNs referral or appointments made? N/A    ED Visit Averted N/A    Life-Saving Intervention Made N/A

## 2022-08-17 DIAGNOSIS — Z419 Encounter for procedure for purposes other than remedying health state, unspecified: Secondary | ICD-10-CM | POA: Diagnosis not present

## 2022-09-16 DIAGNOSIS — Z419 Encounter for procedure for purposes other than remedying health state, unspecified: Secondary | ICD-10-CM | POA: Diagnosis not present

## 2022-10-17 DIAGNOSIS — Z419 Encounter for procedure for purposes other than remedying health state, unspecified: Secondary | ICD-10-CM | POA: Diagnosis not present

## 2022-11-16 DIAGNOSIS — Z419 Encounter for procedure for purposes other than remedying health state, unspecified: Secondary | ICD-10-CM | POA: Diagnosis not present

## 2022-12-17 DIAGNOSIS — Z419 Encounter for procedure for purposes other than remedying health state, unspecified: Secondary | ICD-10-CM | POA: Diagnosis not present

## 2022-12-21 ENCOUNTER — Ambulatory Visit (INDEPENDENT_AMBULATORY_CARE_PROVIDER_SITE_OTHER): Payer: Medicaid Other | Admitting: *Deleted

## 2022-12-21 VITALS — BP 148/101 | HR 97 | Ht 63.0 in | Wt 176.0 lb

## 2022-12-21 DIAGNOSIS — Z3201 Encounter for pregnancy test, result positive: Secondary | ICD-10-CM

## 2022-12-21 DIAGNOSIS — N926 Irregular menstruation, unspecified: Secondary | ICD-10-CM | POA: Diagnosis not present

## 2022-12-21 LAB — POCT URINE PREGNANCY: Preg Test, Ur: POSITIVE — AB

## 2022-12-21 NOTE — Progress Notes (Signed)
   NURSE VISIT- PREGNANCY CONFIRMATION   SUBJECTIVE:  Suzanne Mack is a 27 y.o. G31P2002 female by certain LMP of Patient's last menstrual period was 10/25/2022., however, states she has only had 3 periods since her last baby August 25.  Last known intercourse was in May and doesn't remember if she has had intercourse since her last cycle.  Here for pregnancy confirmation.  Home pregnancy test: positive x 1   She reports no complaints.  She is not taking prenatal vitamins.    OBJECTIVE:  BP (!) 148/101   Pulse 97   Ht 5\' 3"  (1.6 m)   Wt 176 lb (79.8 kg)   LMP 10/25/2022   Breastfeeding No   BMI 31.18 kg/m   Appears well, in no apparent distress  Results for orders placed or performed in visit on 12/21/22 (from the past 24 hour(s))  POCT urine pregnancy   Collection Time: 12/21/22  4:47 PM  Result Value Ref Range   Preg Test, Ur Positive (A) Negative    ASSESSMENT: Positive pregnancy test, LMP 10/25/22 Irregular periods BP elevated-pt nervous   PLAN: HCG ordered Schedule for dating ultrasound TBD based on HCG Prenatal vitamins: plans to begin OTC ASAP   Nausea medicines: not currently needed   OB packet given: No Advised to check BP daily   Jobe Marker  12/21/2022 4:48 PM

## 2023-01-13 ENCOUNTER — Encounter: Payer: Self-pay | Admitting: Women's Health

## 2023-01-13 ENCOUNTER — Encounter: Payer: Self-pay | Admitting: Family Medicine

## 2023-01-13 ENCOUNTER — Ambulatory Visit (INDEPENDENT_AMBULATORY_CARE_PROVIDER_SITE_OTHER): Payer: Medicaid Other

## 2023-01-13 ENCOUNTER — Other Ambulatory Visit: Payer: Self-pay

## 2023-01-13 ENCOUNTER — Other Ambulatory Visit: Payer: Self-pay | Admitting: Obstetrics & Gynecology

## 2023-01-13 DIAGNOSIS — N926 Irregular menstruation, unspecified: Secondary | ICD-10-CM | POA: Diagnosis not present

## 2023-01-13 DIAGNOSIS — O3680X0 Pregnancy with inconclusive fetal viability, not applicable or unspecified: Secondary | ICD-10-CM

## 2023-01-17 DIAGNOSIS — Z419 Encounter for procedure for purposes other than remedying health state, unspecified: Secondary | ICD-10-CM | POA: Diagnosis not present

## 2023-02-04 ENCOUNTER — Encounter: Payer: Self-pay | Admitting: *Deleted

## 2023-02-04 DIAGNOSIS — O0990 Supervision of high risk pregnancy, unspecified, unspecified trimester: Secondary | ICD-10-CM | POA: Insufficient documentation

## 2023-02-04 DIAGNOSIS — O09299 Supervision of pregnancy with other poor reproductive or obstetric history, unspecified trimester: Secondary | ICD-10-CM

## 2023-02-04 DIAGNOSIS — Z3490 Encounter for supervision of normal pregnancy, unspecified, unspecified trimester: Secondary | ICD-10-CM | POA: Insufficient documentation

## 2023-02-04 HISTORY — DX: Supervision of pregnancy with other poor reproductive or obstetric history, unspecified trimester: O09.299

## 2023-02-09 ENCOUNTER — Encounter: Payer: Medicaid Other | Admitting: *Deleted

## 2023-02-09 ENCOUNTER — Encounter: Payer: Self-pay | Admitting: Women's Health

## 2023-02-09 ENCOUNTER — Ambulatory Visit: Payer: Medicaid Other | Admitting: Women's Health

## 2023-02-09 ENCOUNTER — Encounter: Payer: Self-pay | Admitting: Obstetrics & Gynecology

## 2023-02-09 VITALS — BP 133/94 | HR 84 | Wt 175.0 lb

## 2023-02-09 DIAGNOSIS — O09291 Supervision of pregnancy with other poor reproductive or obstetric history, first trimester: Secondary | ICD-10-CM | POA: Diagnosis not present

## 2023-02-09 DIAGNOSIS — O10911 Unspecified pre-existing hypertension complicating pregnancy, first trimester: Secondary | ICD-10-CM

## 2023-02-09 DIAGNOSIS — Z3A12 12 weeks gestation of pregnancy: Secondary | ICD-10-CM | POA: Diagnosis not present

## 2023-02-09 DIAGNOSIS — Z3480 Encounter for supervision of other normal pregnancy, unspecified trimester: Secondary | ICD-10-CM

## 2023-02-09 DIAGNOSIS — Z8759 Personal history of other complications of pregnancy, childbirth and the puerperium: Secondary | ICD-10-CM | POA: Diagnosis not present

## 2023-02-09 DIAGNOSIS — O09299 Supervision of pregnancy with other poor reproductive or obstetric history, unspecified trimester: Secondary | ICD-10-CM | POA: Diagnosis not present

## 2023-02-09 DIAGNOSIS — I10 Essential (primary) hypertension: Secondary | ICD-10-CM | POA: Insufficient documentation

## 2023-02-09 DIAGNOSIS — Z8632 Personal history of gestational diabetes: Secondary | ICD-10-CM

## 2023-02-09 DIAGNOSIS — O0991 Supervision of high risk pregnancy, unspecified, first trimester: Secondary | ICD-10-CM

## 2023-02-09 DIAGNOSIS — Z8279 Family history of other congenital malformations, deformations and chromosomal abnormalities: Secondary | ICD-10-CM | POA: Diagnosis not present

## 2023-02-09 DIAGNOSIS — O10919 Unspecified pre-existing hypertension complicating pregnancy, unspecified trimester: Secondary | ICD-10-CM | POA: Insufficient documentation

## 2023-02-09 MED ORDER — ASPIRIN 81 MG PO TBEC: 162.0000 mg | DELAYED_RELEASE_TABLET | Freq: Every day | ORAL | 2 refills | Status: DC

## 2023-02-09 MED ORDER — LABETALOL HCL 200 MG PO TABS: 200.0000 mg | ORAL_TABLET | Freq: Two times a day (BID) | ORAL | 3 refills | Status: DC

## 2023-02-09 NOTE — Patient Instructions (Signed)
Suzanne Mack, thank you for choosing our office today! We appreciate the opportunity to meet your healthcare needs. You may receive a short survey by mail, e-mail, or through [employer]. If you are happy with your care we would appreciate if you could take just a few minutes to complete the survey questions. We read all of your comments and take your feedback very seriously. Thank you again for choosing our office.  Center for [employer] [facility] Team at [facility]  [facility] at [facility] ([address] [place], [place] [address]) Entrance C, located off of [address] Free 24/7 valet parking   Nausea & Vomiting Have saltine crackers or pretzels by your bed and eat a few bites before you raise your head out of bed in the morning Eat small frequent meals throughout the day instead of large meals Drink plenty of fluids throughout the day to stay hydrated, just don't drink a lot of fluids with your meals.  This can make your stomach fill up faster making you feel sick Do not brush your teeth right after you eat Products with real ginger are good for nausea, like ginger ale and ginger hard candy Make sure it says made with real ginger! Sucking on sour candy like lemon heads is also good for nausea If your prenatal vitamins make you nauseated, take them at night so you will sleep through the nausea Sea Bands If you feel like you need medicine for the nausea & vomiting please let us know If you are unable to keep any fluids or food down please let us know   Constipation Drink plenty of fluid, preferably water, throughout the day Eat foods high in fiber such as fruits, vegetables, and grains Exercise, such as walking, is a good way to keep your bowels regular Drink warm fluids, especially warm prune juice, or decaf coffee Eat a 1/2 cup of real oatmeal (not instant), 1/2 cup applesauce, and 1/2-1 cup warm prune juice every day If needed, you may take Colace (docusate sodium) stool  softener once or twice a day to help keep the stool soft.  If you still are having problems with constipation, you may take Miralax once daily as needed to help keep your bowels regular.   Home Blood Pressure Monitoring for Patients   Your provider has recommended that you check your blood pressure (BP) at least once a week at home. If you do not have a blood pressure cuff at home, one will be provided for you. Contact your provider if you have not received your monitor within 1 week.   Helpful Tips for Accurate Home Blood Pressure Checks  Don't smoke, exercise, or drink caffeine 30 minutes before checking your BP Use the restroom before checking your BP (a full bladder can raise your pressure) Relax in a comfortable upright chair Feet on the ground Left arm resting comfortably on a flat surface at the level of your heart Legs uncrossed Back supported Sit quietly and don't talk Place the cuff on your bare arm Adjust snuggly, so that only two fingertips can fit between your skin and the top of the cuff Check 2 readings separated by at least one minute Keep a log of your BP readings For a visual, please reference this diagram: http://ccnc.care/bpdiagram  Provider Name: [facility]     Phone: [phone]  Zone 1: ALL CLEAR  Continue to monitor your symptoms:  BP reading is less than 140 (top number) or less than 90 (bottom  number)  No right upper stomach pain No headaches or seeing spots No feeling nauseated or throwing up No swelling in face and hands  Zone 2: CAUTION Call your doctor's office for any of the following:  BP reading is greater than 140 (top number) or greater than 90 (bottom number)  Stomach pain under your ribs in the middle or right side Headaches or seeing spots Feeling nauseated or throwing up Swelling in face and hands  Zone 3: EMERGENCY  Seek immediate medical care if you have any of the following:  BP reading is greater than160 (top number) or  greater than 110 (bottom number) Severe headaches not improving with Tylenol Serious difficulty catching your breath Any worsening symptoms from Zone 2    First Trimester of Pregnancy The first trimester of pregnancy is from week 1 until the end of week 12 (months 1 through 3). A week after a sperm fertilizes an egg, the egg will implant on the wall of the uterus. This embryo will begin to develop into a baby. Genes from you and your partner are forming the baby. The female genes determine whether the baby is a boy or a girl. At 6-8 weeks, the eyes and face are formed, and the heartbeat can be seen on ultrasound. At the end of 12 weeks, all the baby's organs are formed.  Now that you are pregnant, you will want to do everything you can to have a healthy baby. Two of the most important things are to get good prenatal care and to follow your health care provider's instructions. Prenatal care is all the medical care you receive before the baby's birth. This care will help prevent, find, and treat any problems during the pregnancy and childbirth. BODY CHANGES Your body goes through many changes during pregnancy. The changes vary from woman to woman.  You may gain or lose a couple of pounds at first. You may feel sick to your stomach (nauseous) and throw up (vomit). If the vomiting is uncontrollable, call your health care provider. You may tire easily. You may develop headaches that can be relieved by medicines approved by your health care provider. You may urinate more often. Painful urination may mean you have a bladder infection. You may develop heartburn as a result of your pregnancy. You may develop constipation because certain hormones are causing the muscles that push waste through your intestines to slow down. You may develop hemorrhoids or swollen, bulging veins (varicose veins). Your breasts may begin to grow larger and become tender. Your nipples may stick out more, and the tissue that  surrounds them (areola) may become darker. Your gums may bleed and may be sensitive to brushing and flossing. Dark spots or blotches (chloasma, mask of pregnancy) may develop on your face. This will likely fade after the baby is born. Your menstrual periods will stop. You may have a loss of appetite. You may develop cravings for certain kinds of food. You may have changes in your emotions from day to day, such as being excited to be pregnant or being concerned that something may go wrong with the pregnancy and baby. You may have more vivid and strange dreams. You may have changes in your hair. These can include thickening of your hair, rapid growth, and changes in texture. Some women also have hair loss during or after pregnancy, or hair that feels dry or thin. Your hair will most likely return to normal after your baby is born. WHAT TO EXPECT AT YOUR PRENATAL  VISITS During a routine prenatal visit: You will be weighed to make sure you and the baby are growing normally. Your blood pressure will be taken. Your abdomen will be measured to track your baby's growth. The fetal heartbeat will be listened to starting around week 10 or 12 of your pregnancy. Test results from any previous visits will be discussed. Your health care provider may ask you: How you are feeling. If you are feeling the baby move. If you have had any abnormal symptoms, such as leaking fluid, bleeding, severe headaches, or abdominal cramping. If you have any questions. Other tests that may be performed during your first trimester include: Blood tests to find your blood type and to check for the presence of any previous infections. They will also be used to check for low iron levels (anemia) and Rh antibodies. Later in the pregnancy, blood tests for diabetes will be done along with other tests if problems develop. Urine tests to check for infections, diabetes, or protein in the urine. An ultrasound to confirm the proper growth  and development of the baby. An amniocentesis to check for possible genetic problems. Fetal screens for spina bifida and Down syndrome. You may need other tests to make sure you and the baby are doing well. HOME CARE INSTRUCTIONS  Medicines Follow your health care provider's instructions regarding medicine use. Specific medicines may be either safe or unsafe to take during pregnancy. Take your prenatal vitamins as directed. If you develop constipation, try taking a stool softener if your health care provider approves. Diet Eat regular, well-balanced meals. Choose a variety of foods, such as meat or vegetable-based protein, fish, milk and low-fat dairy products, vegetables, fruits, and whole grain breads and cereals. Your health care provider will help you determine the amount of weight gain that is right for you. Avoid raw meat and uncooked cheese. These carry germs that can cause birth defects in the baby. Eating four or five small meals rather than three large meals a day may help relieve nausea and vomiting. If you start to feel nauseous, eating a few soda crackers can be helpful. Drinking liquids between meals instead of during meals also seems to help nausea and vomiting. If you develop constipation, eat more high-fiber foods, such as fresh vegetables or fruit and whole grains. Drink enough fluids to keep your urine clear or pale yellow. Activity and Exercise Exercise only as directed by your health care provider. Exercising will help you: Control your weight. Stay in shape. Be prepared for labor and delivery. Experiencing pain or cramping in the lower abdomen or low back is a good sign that you should stop exercising. Check with your health care provider before continuing normal exercises. Try to avoid standing for long periods of time. Move your legs often if you must stand in one place for a long time. Avoid heavy lifting. Wear low-heeled shoes, and practice good posture. You may  continue to have sex unless your health care provider directs you otherwise. Relief of Pain or Discomfort Wear a good support bra for breast tenderness.   Take warm sitz baths to soothe any pain or discomfort caused by hemorrhoids. Use hemorrhoid cream if your health care provider approves.   Rest with your legs elevated if you have leg cramps or low back pain. If you develop varicose veins in your legs, wear support hose. Elevate your feet for 15 minutes, 3-4 times a day. Limit salt in your diet. Prenatal Care Schedule your prenatal visits by the  twelfth week of pregnancy. They are usually scheduled monthly at first, then more often in the last 2 months before delivery. Write down your questions. Take them to your prenatal visits. Keep all your prenatal visits as directed by your health care provider. Safety Wear your seat belt at all times when driving. Make a list of emergency phone numbers, including numbers for family, friends, the hospital, and police and fire departments. General Tips Ask your health care provider for a referral to a local prenatal education class. Begin classes no later than at the beginning of month 6 of your pregnancy. Ask for help if you have counseling or nutritional needs during pregnancy. Your health care provider can offer advice or refer you to specialists for help with various needs. Do not use hot tubs, steam rooms, or saunas. Do not douche or use tampons or scented sanitary pads. Do not cross your legs for long periods of time. Avoid cat litter boxes and soil used by cats. These carry germs that can cause birth defects in the baby and possibly loss of the fetus by miscarriage or stillbirth. Avoid all smoking, herbs, alcohol, and medicines not prescribed by your health care provider. Chemicals in these affect the formation and growth of the baby. Schedule a dentist appointment. At home, brush your teeth with a soft toothbrush and be gentle when you floss. SEEK  MEDICAL CARE IF:  You have dizziness. You have mild pelvic cramps, pelvic pressure, or nagging pain in the abdominal area. You have persistent nausea, vomiting, or diarrhea. You have a bad smelling vaginal discharge. You have pain with urination. You notice increased swelling in your face, hands, legs, or ankles. SEEK IMMEDIATE MEDICAL CARE IF:  You have a fever. You are leaking fluid from your vagina. You have spotting or bleeding from your vagina. You have severe abdominal cramping or pain. You have rapid weight gain or loss. You vomit blood or material that looks like coffee grounds. You are exposed to [place] measles and have never had them. You are exposed to fifth disease or chickenpox. You develop a severe headache. You have shortness of breath. You have any kind of trauma, such as from a fall or a car accident. Document Released: 04/28/2001 Document Revised: 09/18/2013 Document Reviewed: 03/14/2013 [facility] Patient Information 2015 [place], [place]. This information is not intended to replace advice given to you by your health care provider. Make sure you discuss any questions you have with your health care provider.

## 2023-02-09 NOTE — Progress Notes (Signed)
INITIAL OBSTETRICAL VISIT Patient name: Suzanne Mack MRN [id number]  Date of birth: 23-Apr-1996 Chief Complaint:   Initial Prenatal Visit  History of Present Illness:   Dina Condrey is a 27 y.o. G5P2002 Caucasian female at [redacted]w[redacted]d by [place] at 8 weeks with an Estimated Date of Delivery: 08/23/23 being seen today for her initial obstetrical visit.   Patient's last menstrual period was 10/25/2022. 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   Today she reports  has only check home bp once and didn't know how to read it.  .  Last pap 05/15/21. Results were: NILM w/ HRHPV negative     11/04/2021   10:08 AM 07/14/2021    2:32 PM 05/15/2021    1:32 PM  Depression screen PHQ 2/9  Decreased Interest 0 2 1  Down, Depressed, Hopeless 0 0 1  PHQ - 2 Score 0 2 2  Altered sleeping 0 0 0  Tired, decreased energy 2 1 1   Change in appetite 0 0 0  Feeling bad or failure about yourself  0 0 1  Trouble concentrating 0 0 0  Moving slowly or fidgety/restless 0 0 0  Suicidal thoughts 0 0 0  PHQ-9 Score 2 3 4         11/04/2021   10:08 AM 07/14/2021    2:33 PM 05/15/2021    1:32 PM  GAD 7 : Generalized Anxiety Score  Nervous, Anxious, on Edge 0 1 0  Control/stop worrying 0 0 0  Worry too much - different things 0 0 0  Trouble relaxing 0 1 0  Restless 0 0 0  Easily annoyed or irritable 0 2 2  Afraid - awful might happen 0 1 1  Total GAD 7 Score 0 5 3     Review of Systems:   Pertinent items are noted in HPI Denies cramping/contractions, leakage of fluid, vaginal bleeding, abnormal vaginal discharge w/ itching/odor/irritation, headaches, visual changes, shortness of breath, chest pain, abdominal pain, severe nausea/vomiting, or problems with urination or bowel movements unless otherwise stated above.  Pertinent History Reviewed:  Reviewed past medical,surgical, social, obstetrical  and family history.  Reviewed problem list, medications and allergies. OB History  Gravida Para Term Preterm AB Living  3 2 2  0 0 2  SAB IAB Ectopic Multiple Live Births  0 0 0 0 2    # Outcome Date GA Lbr Len/2nd Weight Sex Type Anes PTL Lv  3 Current           2 Term 01/09/22 [redacted]w[redacted]d 05:25 / 00:10 6 lb 4.9 oz (2.86 kg) M Vag-Spont None N LIV     Complications: Gestational diabetes, Gestational hypertension  1 Term 12/02/17 [redacted]w[redacted]d  6 lb 11 oz (3.033 kg) F Vag-Spont EPI N LIV     Complications: Gestational diabetes   Physical Assessment:   Vitals:   02/09/23 1403 02/09/23 1404  BP: (!) 142/91 (!) 133/94  Pulse: 80 84  Weight: 175 lb (79.4 kg)   Body mass index is 31 kg/m.       Physical Examination:  General appearance - well appearing, and in no distress  Mental status - alert, oriented to person, place, and time  Psych:  She has a normal mood and affect  Skin - warm and dry, normal color, no suspicious lesions noted  Chest - effort normal, all lung fields clear  to auscultation bilaterally  Heart - normal rate and regular rhythm  Abdomen - soft, nontender  Extremities:  No swelling or varicosities noted  Thin prep pap is not done   Chaperone: N/A    TODAY'S FHR 152 via doppler  No results found for this or any previous visit (from the past 24 hour(s)).  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 Swallow whole.    Dispense:  180 tablet    Refill:  2   labetalol (NORMODYNE) 200 MG tablet    Sig: Take 1 tablet (200 mg total) by mouth 2  (two) times daily.    Dispense:  60 tablet    Refill:  3    Initial labs obtained Continue prenatal vitamins Reviewed n/v relief measures and warning s/s to report Reviewed recommended weight gain based on pre-gravid BMI Encouraged well-balanced diet Genetic & carrier screening discussed: requests Panorama, declines NT/IT, AFP, and Horizon  Ultrasound discussed; fetal survey: requested CCNC completed> form faxed if has or is planning to apply for medicaid The nature of [place] [facility] [employer] with multiple MDs and other Advanced Practice Providers was explained to patient; also emphasized that fellows, residents, and students are part of our team. Does have home bp cuff. Office bp cuff given: no. Rx sent: n/a. Check bp weekly, let us know if consistently >140/90.   Indications for ASA therapy (per uptodate) One of the following: CHTN Yes  Indications for early A1C (per uptodate) BMI >=25 (>=23 in Asian women) AND one of the following GDM in a previous pregnancy Yes   Follow-up: Return in about 4 weeks (around 03/09/2023) for HROB, MD or CNM, in person; then 7wks for HROB and anatomy u/s.   Orders Placed This Encounter  Procedures   GC/Chlamydia Probe Amp   Urine Culture   CBC/D/Plt+RPR+Rh+ABO+RubIgG...   PANORAMA PRENATAL TEST   Hemoglobin A1c   Protein / creatinine ratio, urine   Comprehensive metabolic panel    Cheral Marker CNM, [facility] 02/09/2023 2:56 PM

## 2023-02-10 ENCOUNTER — Encounter: Payer: Self-pay | Admitting: Women's Health

## 2023-02-10 LAB — CBC/D/PLT+RPR+RH+ABO+RUBIGG...
Antibody Screen: NEGATIVE
Basophils Absolute: 0 10*3/uL (ref 0.0–0.2)
Basos: 0 %
EOS (ABSOLUTE): 0.1 10*3/uL (ref 0.0–0.4)
Eos: 1 %
HCV Ab: NONREACTIVE
HIV Screen 4th Generation wRfx: NONREACTIVE
Hematocrit: 39.5 % (ref 34.0–46.6)
Hemoglobin: 13.1 g/dL (ref 11.1–15.9)
Hepatitis B Surface Ag: NEGATIVE
Immature Grans (Abs): 0.2 10*3/uL — ABNORMAL HIGH (ref 0.0–0.1)
Immature Granulocytes: 1 %
Lymphocytes Absolute: 2 10*3/uL (ref 0.7–3.1)
Lymphs: 18 %
MCH: 30 pg (ref 26.6–33.0)
MCHC: 33.2 g/dL (ref 31.5–35.7)
MCV: 91 fL (ref 79–97)
Monocytes Absolute: 0.7 10*3/uL (ref 0.1–0.9)
Monocytes: 6 %
Neutrophils Absolute: 8.1 10*3/uL — ABNORMAL HIGH (ref 1.4–7.0)
Neutrophils: 74 %
Platelets: 180 10*3/uL (ref 150–450)
RBC: 4.36 x10E6/uL (ref 3.77–5.28)
RDW: 12.4 % (ref 11.7–15.4)
RPR Ser Ql: NONREACTIVE
Rh Factor: POSITIVE
Rubella Antibodies, IGG: 1.16 index (ref >0.99)
WBC: 11 10*3/uL — ABNORMAL HIGH (ref 3.4–10.8)

## 2023-02-10 LAB — COMPREHENSIVE METABOLIC PANEL
ALT: 13 IU/L (ref 0–32)
AST: 16 IU/L (ref 0–40)
Albumin: 4.2 g/dL (ref 4.0–5.0)
Alkaline Phosphatase: 53 IU/L (ref 44–121)
BUN/Creatinine Ratio: 15 (ref 9–23)
BUN: 8 mg/dL (ref 6–20)
Bilirubin Total: <0.2 mg/dL (ref 0.0–1.2)
CO2: 22 mmol/L (ref 20–29)
Calcium: 9.4 mg/dL (ref 8.7–10.2)
Chloride: 98 mmol/L (ref 96–106)
Creatinine, Ser: 0.55 mg/dL — ABNORMAL LOW (ref 0.57–1.00)
Globulin, Total: 2.7 g/dL (ref 1.5–4.5)
Glucose: 71 mg/dL (ref 70–99)
Potassium: 4.2 mmol/L (ref 3.5–5.2)
Sodium: 137 mmol/L (ref 134–144)
Total Protein: 6.9 g/dL (ref 6.0–8.5)
eGFR: 128 mL/min/{1.73_m2} (ref >59)

## 2023-02-10 LAB — PROTEIN / CREATININE RATIO, URINE
Creatinine, Urine: 167.7 mg/dL
Protein, Ur: 9.8 mg/dL
Protein/Creat Ratio: 58 mg/g creat (ref 0–200)

## 2023-02-10 LAB — HEMOGLOBIN A1C
Est. average glucose Bld gHb Est-mCnc: 111 mg/dL
Hgb A1c MFr Bld: 5.5 % (ref 4.8–5.6)

## 2023-02-10 LAB — HCV INTERPRETATION

## 2023-02-11 LAB — URINE CULTURE: Organism ID, Bacteria: NO GROWTH

## 2023-02-13 LAB — GC/CHLAMYDIA PROBE AMP
Chlamydia trachomatis, NAA: NEGATIVE
Neisseria Gonorrhoeae by PCR: NEGATIVE

## 2023-02-16 DIAGNOSIS — Z419 Encounter for procedure for purposes other than remedying health state, unspecified: Secondary | ICD-10-CM | POA: Diagnosis not present

## 2023-02-16 LAB — PANORAMA PRENATAL TEST FULL PANEL:PANORAMA TEST PLUS 5 ADDITIONAL MICRODELETIONS: FETAL FRACTION: 3.1

## 2023-03-09 ENCOUNTER — Ambulatory Visit (INDEPENDENT_AMBULATORY_CARE_PROVIDER_SITE_OTHER): Payer: Medicaid Other | Admitting: Women's Health

## 2023-03-09 ENCOUNTER — Encounter: Payer: Self-pay | Admitting: Women's Health

## 2023-03-09 VITALS — BP 124/82 | HR 88 | Wt 172.2 lb

## 2023-03-09 DIAGNOSIS — O0990 Supervision of high risk pregnancy, unspecified, unspecified trimester: Secondary | ICD-10-CM

## 2023-03-09 DIAGNOSIS — Z363 Encounter for antenatal screening for malformations: Secondary | ICD-10-CM

## 2023-03-09 DIAGNOSIS — Z3A16 16 weeks gestation of pregnancy: Secondary | ICD-10-CM

## 2023-03-09 DIAGNOSIS — O0992 Supervision of high risk pregnancy, unspecified, second trimester: Secondary | ICD-10-CM

## 2023-03-09 NOTE — Progress Notes (Signed)
HIGH-RISK PREGNANCY VISIT Patient name: Suzanne Mack MRN [id number]  Date of birth: 19-Jul-1995 Chief Complaint:   Routine Prenatal Visit  History of Present Illness:   Suzanne Mack is a 27 y.o. G15P2002 female at [redacted]w[redacted]d with an Estimated Date of Delivery: 08/23/23 being seen today for ongoing management of a high-risk pregnancy complicated by chronic hypertension currently on labetalol 200mg  BID.    Today she reports no complaints. Contractions: Not present.  .  Movement: Absent. denies leaking of fluid.      11/04/2021   10:08 AM 07/14/2021    2:32 PM 05/15/2021    1:32 PM  Depression screen PHQ 2/9  Decreased Interest 0 2 1  Down, Depressed, Hopeless 0 0 1  PHQ - 2 Score 0 2 2  Altered sleeping 0 0 0  Tired, decreased energy 2 1 1   Change in appetite 0 0 0  Feeling bad or failure about yourself  0 0 1  Trouble concentrating 0 0 0  Moving slowly or fidgety/restless 0 0 0  Suicidal thoughts 0 0 0  PHQ-9 Score 2 3 4         11/04/2021   10:08 AM 07/14/2021    2:33 PM 05/15/2021    1:32 PM  GAD 7 : Generalized Anxiety Score  Nervous, Anxious, on Edge 0 1 0  Control/stop worrying 0 0 0  Worry too much - different things 0 0 0  Trouble relaxing 0 1 0  Restless 0 0 0  Easily annoyed or irritable 0 2 2  Afraid - awful might happen 0 1 1  Total GAD 7 Score 0 5 3     Review of Systems:   Pertinent items are noted in HPI Denies abnormal vaginal discharge w/ itching/odor/irritation, headaches, visual changes, shortness of breath, chest pain, abdominal pain, severe nausea/vomiting, or problems with urination or bowel movements unless otherwise stated above. Pertinent History Reviewed:  Reviewed past medical,surgical, social, obstetrical and family history.  Reviewed problem list, medications and allergies. Physical Assessment:   Vitals:   03/09/23 1057  BP: 124/82  Pulse: 88  Weight: 172 lb 3.2 oz (78.1 kg)  Body mass index is 30.5 kg/m.           Physical  Examination:   General appearance: alert, well appearing, and in no distress  Mental status: alert, oriented to person, place, and time  Skin: warm & dry   Extremities:      Cardiovascular: normal heart rate noted  Respiratory: normal respiratory effort, no distress  Abdomen: gravid, soft, non-tender  Pelvic: Cervical exam deferred         Fetal Status: Fetal Heart Rate (bpm): 150   Movement: Absent    Fetal Surveillance Testing today: doppler   Chaperone: N/A    No results found for this or any previous visit (from the past 24 hour(s)).  Assessment & Plan:  High-risk pregnancy: G3P2002 at [redacted]w[redacted]d with an Estimated Date of Delivery: 08/23/23   1) CHTN, stable on labetalol 200mg  BID, not taking ASA-discussed importance  2) Son born w/ transposition of great arteries, plan echo ~24wks  Meds: No orders of the defined types were placed in this encounter.   Labs/procedures today: none  Treatment Plan:   [place] 24, 28, 32, 36wks    2x/wk testing nst/sono @ 32wks     Deliver 37-39wks (36-37wks or earlier if poor control)____   Reviewed: Preterm labor symptoms and general obstetric precautions including but not limited to vaginal bleeding, contractions, leaking  of fluid and fetal movement were reviewed in detail with the patient.  All questions were answered. Does have home bp cuff. Office bp cuff given: not applicable. Check bp weekly, let us know if consistently >140 and/or >90.  Follow-up: Return for As scheduled.   Future Appointments  Date Time Provider Department Center  03/30/2023  3:00 PM [facility] 2 [facility] None  03/30/2023  3:50 PM Eure, Amaryllis Dyke, MD [facility]    Orders Placed This Encounter  Procedures   US OB Comp + [address] [place], [facility] 03/09/2023 11:27 AM

## 2023-03-09 NOTE — Patient Instructions (Signed)
Suzanne Mack, thank you for choosing our office today! We appreciate the opportunity to meet your healthcare needs. You may receive a short survey by mail, e-mail, or through MyChart. If you are happy with your care we would appreciate if you could take just a few minutes to complete the survey questions. We read all of your comments and take your feedback very seriously. Thank you again for choosing our office.  [facility] Team at [facility] ([address] [place], [place] [address]) Entrance C, located off of [address] Free 24/7 valet parking  Go to [email or web address] to register for FREE online childbirth classes  Call the office ([phone]) or go to [facility] if: You begin to severe cramping Your water breaks.  Sometimes it is a big gush of fluid, sometimes it is just a trickle that keeps getting your panties wet or running down your legs You have vaginal bleeding.  It is normal to have a small amount of spotting if your cervix was checked.   [place] Pediatricians/Family Doctors [facility] ([facility]): [address], [phone]           [facility]: [address], [phone]                [facility] ([facility]): [address], [phone] (call to ask if accepting patients) [facility]: [address], [place], [phone]    [place] Pediatricians/Family Doctors [facility] ([facility]): [address], [phone] [facility]: [address], [phone] [facility]: [address], [phone]  [place] [facility]  [facility] ([facility]): [phone] [facility]: [address], [phone]   [place] Family Doctors [facility]: [address], [phone]  [place] [facility]  [facility]: [address], [phone]  Home Blood Pressure Monitoring for Patients   Your provider has recommended that you check your blood pressure (BP) at least once a week at home. If you do not have a blood pressure cuff at home, one will be provided for you. Contact your provider if you have not received your monitor within 1 week.   Helpful Tips for Accurate Home Blood Pressure Checks  Don't smoke, exercise, or drink caffeine 30 minutes before checking your BP Use the restroom before checking your BP (a full bladder can raise your pressure) Relax in a comfortable upright chair Feet on the ground Left arm resting comfortably on a flat surface at the level of your heart Legs uncrossed Back supported Sit quietly and don't talk Place the cuff on your bare arm Adjust snuggly, so that only two fingertips can fit between your skin and the top of the cuff Check 2 readings separated by at least one minute Keep a log of your BP readings For a visual, please reference this diagram: http://ccnc.care/bpdiagram  Provider Name: [facility]     Phone: [phone]  Zone 1: ALL CLEAR  Continue to monitor your symptoms:  BP reading is less than 140 (top number) or less than 90 (bottom number)  No right upper stomach pain No headaches or seeing spots No feeling nauseated or throwing up No swelling in face and hands  Zone 2: CAUTION Call your doctor's office for any of the following:  BP reading is greater than 140 (top number) or greater than   90 (bottom number)  Stomach pain under your ribs in the middle or right side Headaches or seeing spots Feeling nauseated or throwing up Swelling in face and hands  Zone 3: EMERGENCY  Seek immediate medical care if you have any of the following:  BP reading is greater than160 (top number) or greater than 110 (bottom number) Severe headaches not improving with Tylenol Serious difficulty catching your breath Any worsening symptoms from  Zone 2     Second Trimester of Pregnancy The second trimester is from week 14 through week 27 (months 4 through 6). The second trimester is often a time when you feel your best. Your body has adjusted to being pregnant, and you begin to feel better physically. Usually, morning sickness has lessened or quit completely, you may have more energy, and you may have an increase in appetite. The second trimester is also a time when the fetus is growing rapidly. At the end of the sixth month, the fetus is about 9 inches long and weighs about 1 pounds. You will likely begin to feel the baby move (quickening) between 16 and 20 weeks of pregnancy. Body changes during your second trimester Your body continues to go through many changes during your second trimester. The changes vary from woman to woman. Your weight will continue to increase. You will notice your lower abdomen bulging out. You may begin to get stretch marks on your hips, abdomen, and breasts. You may develop headaches that can be relieved by medicines. The medicines should be approved by your health care provider. You may urinate more often because the fetus is pressing on your bladder. You may develop or continue to have heartburn as a result of your pregnancy. You may develop constipation because certain hormones are causing the muscles that push waste through your intestines to slow down. You may develop hemorrhoids or swollen, bulging veins (varicose veins). You may have back pain. This is caused by: Weight gain. Pregnancy hormones that are relaxing the joints in your pelvis. A shift in weight and the muscles that support your balance. Your breasts will continue to grow and they will continue to become tender. Your gums may bleed and may be sensitive to brushing and flossing. Dark spots or blotches (chloasma, mask of pregnancy) may develop on your face. This will likely fade after the baby is born. A dark line from your belly button to  the pubic area (linea nigra) may appear. This will likely fade after the baby is born. You may have changes in your hair. These can include thickening of your hair, rapid growth, and changes in texture. Some women also have hair loss during or after pregnancy, or hair that feels dry or thin. Your hair will most likely return to normal after your baby is born.  What to expect at prenatal visits During a routine prenatal visit: You will be weighed to make sure you and the fetus are growing normally. Your blood pressure will be taken. Your abdomen will be measured to track your baby's growth. The fetal heartbeat will be listened to. Any test results from the previous visit will be discussed.  Your health care provider may ask you: How you are feeling. If you are feeling the baby move. If you have had any abnormal symptoms, such as leaking fluid, bleeding, severe headaches, or abdominal cramping. If you are using any tobacco products, including cigarettes, chewing tobacco, and electronic cigarettes. If you have any questions.  Other tests that may be performed during   your second trimester include: Blood tests that check for: Low iron levels (anemia). High blood sugar that affects pregnant women (gestational diabetes) between 24 and 28 weeks. Rh antibodies. This is to check for a protein on red blood cells (Rh factor). Urine tests to check for infections, diabetes, or protein in the urine. An ultrasound to confirm the proper growth and development of the baby. An amniocentesis to check for possible genetic problems. Fetal screens for spina bifida and Down syndrome. HIV (human immunodeficiency virus) testing. Routine prenatal testing includes screening for HIV, unless you choose not to have this test.  Follow these instructions at home: Medicines Follow your health care provider's instructions regarding medicine use. Specific medicines may be either safe or unsafe to take during  pregnancy. Take a prenatal vitamin that contains at least 600 micrograms (mcg) of folic acid. If you develop constipation, try taking a stool softener if your health care provider approves. Eating and drinking Eat a balanced diet that includes fresh fruits and vegetables, whole grains, good sources of protein such as meat, eggs, or tofu, and low-fat dairy. Your health care provider will help you determine the amount of weight gain that is right for you. Avoid raw meat and uncooked cheese. These carry germs that can cause birth defects in the baby. If you have low calcium intake from food, talk to your health care provider about whether you should take a daily calcium supplement. Limit foods that are high in fat and processed sugars, such as fried and sweet foods. To prevent constipation: Drink enough fluid to keep your urine clear or pale yellow. Eat foods that are high in fiber, such as fresh fruits and vegetables, whole grains, and beans. Activity Exercise only as directed by your health care provider. Most women can continue their usual exercise routine during pregnancy. Try to exercise for 30 minutes at least 5 days a week. Stop exercising if you experience uterine contractions. Avoid heavy lifting, wear low heel shoes, and practice good posture. A sexual relationship may be continued unless your health care provider directs you otherwise. Relieving pain and discomfort Wear a good support bra to prevent discomfort from breast tenderness. Take warm sitz baths to soothe any pain or discomfort caused by hemorrhoids. Use hemorrhoid cream if your health care provider approves. Rest with your legs elevated if you have leg cramps or low back pain. If you develop varicose veins, wear support hose. Elevate your feet for 15 minutes, 3-4 times a day. Limit salt in your diet. Prenatal Care Write down your questions. Take them to your prenatal visits. Keep all your prenatal visits as told by your health  care provider. This is important. Safety Wear your seat belt at all times when driving. Make a list of emergency phone numbers, including numbers for family, friends, the hospital, and police and fire departments. General instructions Ask your health care provider for a referral to a local prenatal education class. Begin classes no later than the beginning of month 6 of your pregnancy. Ask for help if you have counseling or nutritional needs during pregnancy. Your health care provider can offer advice or refer you to specialists for help with various needs. Do not use hot tubs, steam rooms, or saunas. Do not douche or use tampons or scented sanitary pads. Do not cross your legs for long periods of time. Avoid cat litter boxes and soil used by cats. These carry germs that can cause birth defects in the baby and possibly loss of the   fetus by miscarriage or stillbirth. Avoid all smoking, herbs, alcohol, and unprescribed drugs. Chemicals in these products can affect the formation and growth of the baby. Do not use any products that contain nicotine or tobacco, such as cigarettes and e-cigarettes. If you need help quitting, ask your health care provider. Visit your dentist if you have not gone yet during your pregnancy. Use a soft toothbrush to brush your teeth and be gentle when you floss. Contact a health care provider if: You have dizziness. You have mild pelvic cramps, pelvic pressure, or nagging pain in the abdominal area. You have persistent nausea, vomiting, or diarrhea. You have a bad smelling vaginal discharge. You have pain when you urinate. Get help right away if: You have a fever. You are leaking fluid from your vagina. You have spotting or bleeding from your vagina. You have severe abdominal cramping or pain. You have rapid weight gain or weight loss. You have shortness of breath with chest pain. You notice sudden or extreme swelling of your face, hands, ankles, feet, or legs. You  have not felt your baby move in over an hour. You have severe headaches that do not go away when you take medicine. You have vision changes. Summary The second trimester is from week 14 through week 27 (months 4 through 6). It is also a time when the fetus is growing rapidly. Your body goes through many changes during pregnancy. The changes vary from woman to woman. Avoid all smoking, herbs, alcohol, and unprescribed drugs. These chemicals affect the formation and growth your baby. Do not use any tobacco products, such as cigarettes, chewing tobacco, and e-cigarettes. If you need help quitting, ask your health care provider. Contact your health care provider if you have any questions. Keep all prenatal visits as told by your health care provider. This is important. This information is not intended to replace advice given to you by your health care provider. Make sure you discuss any questions you have with your health care provider. Document Released: 04/28/2001 Document Revised: 10/10/2015 Document Reviewed: 07/05/2012 Elsevier Interactive Patient Education  2017 [employer]  

## 2023-03-19 DIAGNOSIS — Z419 Encounter for procedure for purposes other than remedying health state, unspecified: Secondary | ICD-10-CM | POA: Diagnosis not present

## 2023-03-28 ENCOUNTER — Other Ambulatory Visit: Payer: Self-pay | Admitting: Women's Health

## 2023-03-28 DIAGNOSIS — Z363 Encounter for antenatal screening for malformations: Secondary | ICD-10-CM

## 2023-03-28 DIAGNOSIS — O0990 Supervision of high risk pregnancy, unspecified, unspecified trimester: Secondary | ICD-10-CM

## 2023-03-28 DIAGNOSIS — O0992 Supervision of high risk pregnancy, unspecified, second trimester: Secondary | ICD-10-CM

## 2023-03-28 DIAGNOSIS — Z8759 Personal history of other complications of pregnancy, childbirth and the puerperium: Secondary | ICD-10-CM

## 2023-03-28 DIAGNOSIS — O09292 Supervision of pregnancy with other poor reproductive or obstetric history, second trimester: Secondary | ICD-10-CM

## 2023-03-30 ENCOUNTER — Encounter: Payer: Medicaid Other | Admitting: Obstetrics & Gynecology

## 2023-03-30 ENCOUNTER — Encounter: Payer: Self-pay | Admitting: Obstetrics & Gynecology

## 2023-03-30 ENCOUNTER — Ambulatory Visit (INDEPENDENT_AMBULATORY_CARE_PROVIDER_SITE_OTHER): Payer: Medicaid Other | Admitting: Radiology

## 2023-03-30 ENCOUNTER — Ambulatory Visit (INDEPENDENT_AMBULATORY_CARE_PROVIDER_SITE_OTHER): Payer: Medicaid Other | Admitting: Obstetrics & Gynecology

## 2023-03-30 VITALS — BP 118/82 | HR 91 | Wt 174.0 lb

## 2023-03-30 DIAGNOSIS — O09299 Supervision of pregnancy with other poor reproductive or obstetric history, unspecified trimester: Secondary | ICD-10-CM

## 2023-03-30 DIAGNOSIS — Z363 Encounter for antenatal screening for malformations: Secondary | ICD-10-CM | POA: Diagnosis not present

## 2023-03-30 DIAGNOSIS — O09292 Supervision of pregnancy with other poor reproductive or obstetric history, second trimester: Secondary | ICD-10-CM

## 2023-03-30 DIAGNOSIS — Z8759 Personal history of other complications of pregnancy, childbirth and the puerperium: Secondary | ICD-10-CM

## 2023-03-30 DIAGNOSIS — O10919 Unspecified pre-existing hypertension complicating pregnancy, unspecified trimester: Secondary | ICD-10-CM

## 2023-03-30 DIAGNOSIS — Z3A19 19 weeks gestation of pregnancy: Secondary | ICD-10-CM | POA: Diagnosis not present

## 2023-03-30 DIAGNOSIS — Z8632 Personal history of gestational diabetes: Secondary | ICD-10-CM

## 2023-03-30 DIAGNOSIS — O10912 Unspecified pre-existing hypertension complicating pregnancy, second trimester: Secondary | ICD-10-CM

## 2023-03-30 DIAGNOSIS — O0992 Supervision of high risk pregnancy, unspecified, second trimester: Secondary | ICD-10-CM

## 2023-03-30 DIAGNOSIS — O0990 Supervision of high risk pregnancy, unspecified, unspecified trimester: Secondary | ICD-10-CM

## 2023-03-30 LAB — POCT URINALYSIS DIPSTICK OB
Blood, UA: NEGATIVE
Glucose, UA: NEGATIVE
Leukocytes, UA: NEGATIVE
Nitrite, UA: NEGATIVE

## 2023-03-30 NOTE — Progress Notes (Signed)
GA = 19+1 by U/S Single active female fetus,  Cephalic    FHR = 146bpm Posteror pl,   gr1   SVP = 5.2cm EFW 83%  317g  Anatomy screen completed, no apparent abn Normal ovaries - neg and regions  - neg CDS - no free fluid present CL = 4.0 cm,    closed

## 2023-03-30 NOTE — Progress Notes (Signed)
HIGH-RISK PREGNANCY VISIT Patient name: Suzanne Mack MRN [id number]  Date of birth: Mar 05, 1996 Chief Complaint:   High Risk Gestation ([place] today!!)  History of Present Illness:   Suzanne Mack is a 27 y.o. G39P2002 female at [redacted]w[redacted]d with an Estimated Date of Delivery: 08/23/23 being seen today for ongoing management of a high-risk pregnancy complicated by cHTN on labetalol 200 BID.    Today she reports no complaints. Contractions: Not present. Vag. Bleeding: None.  Movement: Present. denies leaking of fluid.      11/04/2021   10:08 AM 07/14/2021    2:32 PM 05/15/2021    1:32 PM  Depression screen PHQ 2/9  Decreased Interest 0 2 1  Down, Depressed, Hopeless 0 0 1  PHQ - 2 Score 0 2 2  Altered sleeping 0 0 0  Tired, decreased energy 2 1 1   Change in appetite 0 0 0  Feeling bad or failure about yourself  0 0 1  Trouble concentrating 0 0 0  Moving slowly or fidgety/restless 0 0 0  Suicidal thoughts 0 0 0  PHQ-9 Score 2 3 4         11/04/2021   10:08 AM 07/14/2021    2:33 PM 05/15/2021    1:32 PM  GAD 7 : Generalized Anxiety Score  Nervous, Anxious, on Edge 0 1 0  Control/stop worrying 0 0 0  Worry too much - different things 0 0 0  Trouble relaxing 0 1 0  Restless 0 0 0  Easily annoyed or irritable 0 2 2  Afraid - awful might happen 0 1 1  Total GAD 7 Score 0 5 3     Review of Systems:   Pertinent items are noted in HPI Denies abnormal vaginal discharge w/ itching/odor/irritation, headaches, visual changes, shortness of breath, chest pain, abdominal pain, severe nausea/vomiting, or problems with urination or bowel movements unless otherwise stated above. Pertinent History Reviewed:  Reviewed past medical,surgical, social, obstetrical and family history.  Reviewed problem list, medications and allergies. Physical Assessment:   Vitals:   03/30/23 1555  BP: 118/82  Pulse: 91  Weight: 174 lb (78.9 kg)  Body mass index is 30.82 kg/m.           Physical  Examination:   General appearance: alert, well appearing, and in no distress  Mental status: alert, oriented to person, place, and time  Skin: warm & dry   Extremities: Edema: None    Cardiovascular: normal heart rate noted  Respiratory: normal respiratory effort, no distress  Abdomen: gravid, soft, non-tender  Pelvic: Cervical exam deferred         Fetal Status:     Movement: Present    Fetal Surveillance Testing today: sonogram is normal today   Chaperone: N/A    Results for orders placed or performed in visit on 03/30/23 (from the past 24 hour(s))  POC Urinalysis Dipstick OB   Collection Time: 03/30/23  3:46 PM  Result Value Ref Range   Color, UA     Clarity, UA     Glucose, UA Negative Negative   Bilirubin, UA     Ketones, UA trace    Spec Grav, UA     Blood, UA neg    pH, UA     POC,PROTEIN,UA Small (1+) Negative, Trace, Small (1+), Moderate (2+), Large (3+), 4+   Urobilinogen, UA     Nitrite, UA neg    Leukocytes, UA Negative Negative   Appearance     Odor  Assessment & Plan:  High-risk pregnancy: G3P2002 at [redacted]w[redacted]d with an Estimated Date of Delivery: 08/23/23      ICD-10-CM   1. Supervision of high risk pregnancy, antepartum  O09.90 POC Urinalysis Dipstick OB    2. [redacted] weeks gestation of pregnancy  Z3A.19 POC Urinalysis Dipstick OB    3. Chronic hypertension affecting pregnancy: Labetalol 200 BID  O10.919     4. History of gestational diabetes in prior pregnancy, currently pregnant  O09.299    Z86.32     5. History of gestational hypertension  Z87.59         Meds: No orders of the defined types were placed in this encounter.   Orders:  Orders Placed This Encounter  Procedures   POC Urinalysis Dipstick OB     Labs/procedures today: U/S  Treatment Plan:  schedule fetal ECHO at the time of her next visit  Reviewed: Preterm labor symptoms and general obstetric precautions including but not limited to vaginal bleeding, contractions, leaking of fluid  and fetal movement were reviewed in detail with the patient.  All questions were answered. Does have home bp cuff. Office bp cuff given: yes. Check bp daily, let us know if consistently >150 and/or >95.  Follow-up: Return in about 4 weeks (around 04/27/2023) for HROB.   No future appointments.  Orders Placed This Encounter  Procedures   POC Urinalysis Dipstick OB   Lazaro Arms  Attending Physician for the Center for [facility] 03/30/2023 4:12 PM

## 2023-04-18 DIAGNOSIS — Z419 Encounter for procedure for purposes other than remedying health state, unspecified: Secondary | ICD-10-CM | POA: Diagnosis not present

## 2023-04-26 ENCOUNTER — Encounter: Payer: Self-pay | Admitting: Obstetrics & Gynecology

## 2023-04-26 ENCOUNTER — Ambulatory Visit (INDEPENDENT_AMBULATORY_CARE_PROVIDER_SITE_OTHER): Payer: Medicaid Other | Admitting: Obstetrics & Gynecology

## 2023-04-26 VITALS — BP 126/88 | HR 104 | Wt 180.0 lb

## 2023-04-26 DIAGNOSIS — O0990 Supervision of high risk pregnancy, unspecified, unspecified trimester: Secondary | ICD-10-CM

## 2023-04-26 DIAGNOSIS — O10012 Pre-existing essential hypertension complicating pregnancy, second trimester: Secondary | ICD-10-CM

## 2023-04-26 DIAGNOSIS — Z3A23 23 weeks gestation of pregnancy: Secondary | ICD-10-CM

## 2023-04-26 DIAGNOSIS — O10919 Unspecified pre-existing hypertension complicating pregnancy, unspecified trimester: Secondary | ICD-10-CM

## 2023-04-26 NOTE — Progress Notes (Signed)
HIGH-RISK PREGNANCY VISIT Patient name: Suzanne Mack MRN [id number]  Date of birth: [birth year] Chief Complaint:   Routine Prenatal Visit  History of Present Illness:   Suzanne Mack is a 27 y.o. G80P2002 female at [redacted]w[redacted]d with an Estimated Date of Delivery: 08/23/23 being seen today for ongoing management of a high-risk pregnancy complicated by chronic hypertension currently on labetalol 200 BID.    Today she reports no complaints. Contractions: Not present. Vag. Bleeding: None.  Movement: Present. denies leaking of fluid.      11/04/2021   10:08 AM 07/14/2021    2:32 PM 05/15/2021    1:32 PM  Depression screen PHQ 2/9  Decreased Interest 0 2 1  Down, Depressed, Hopeless 0 0 1  PHQ - 2 Score 0 2 2  Altered sleeping 0 0 0  Tired, decreased energy 2 1 1   Change in appetite 0 0 0  Feeling bad or failure about yourself  0 0 1  Trouble concentrating 0 0 0  Moving slowly or fidgety/restless 0 0 0  Suicidal thoughts 0 0 0  PHQ-9 Score 2 3 4         11/04/2021   10:08 AM 07/14/2021    2:33 PM 05/15/2021    1:32 PM  GAD 7 : Generalized Anxiety Score  Nervous, Anxious, on Edge 0 1 0  Control/stop worrying 0 0 0  Worry too much - different things 0 0 0  Trouble relaxing 0 1 0  Restless 0 0 0  Easily annoyed or irritable 0 2 2  Afraid - awful might happen 0 1 1  Total GAD 7 Score 0 5 3     Review of Systems:   Pertinent items are noted in HPI Denies abnormal vaginal discharge w/ itching/odor/irritation, headaches, visual changes, shortness of breath, chest pain, abdominal pain, severe nausea/vomiting, or problems with urination or bowel movements unless otherwise stated above. Pertinent History Reviewed:  Reviewed past medical,surgical, social, obstetrical and family history.  Reviewed problem list, medications and allergies. Physical Assessment:   Vitals:   04/26/23 1418  BP: 126/88  Pulse: (!) 104  Weight: 180 lb (81.6 kg)  Body mass index is 31.89 kg/m.            Physical Examination:   General appearance: alert, well appearing, and in no distress  Mental status: alert, oriented to person, place, and time  Skin: warm & dry   Extremities:      Cardiovascular: normal heart rate noted  Respiratory: normal respiratory effort, no distress  Abdomen: gravid, soft, non-tender  Pelvic: Cervical exam deferred         Fetal Status:     Movement: Present    Fetal Surveillance Testing today: FHR 147   Chaperone:     No results found for this or any previous visit (from the past 24 hour(s)).  Assessment & Plan:  High-risk pregnancy: G3P2002 at [redacted]w[redacted]d with an Estimated Date of Delivery: 08/23/23      ICD-10-CM   1. Supervision of high risk pregnancy, antepartum  O09.90     2. Chronic hypertension affecting pregnancy: Labetalol 200 BID  O10.919        Meds: No orders of the defined types were placed in this encounter.   Orders: No orders of the defined types were placed in this encounter.    Labs/procedures today: none  Treatment Plan:  sonogram 28 weeks, PN2   Follow-up: Return in about 4 months (around 08/25/2023) for PN2, sonogram.  No future appointments.  No orders of the defined types were placed in this encounter.  Lazaro Arms  Attending Physician for the Center for [facility] 04/26/2023 2:46 PM

## 2023-05-19 DIAGNOSIS — Z419 Encounter for procedure for purposes other than remedying health state, unspecified: Secondary | ICD-10-CM | POA: Diagnosis not present

## 2023-05-24 ENCOUNTER — Other Ambulatory Visit: Payer: Self-pay | Admitting: Obstetrics & Gynecology

## 2023-05-24 DIAGNOSIS — O10919 Unspecified pre-existing hypertension complicating pregnancy, unspecified trimester: Secondary | ICD-10-CM

## 2023-05-25 ENCOUNTER — Ambulatory Visit (INDEPENDENT_AMBULATORY_CARE_PROVIDER_SITE_OTHER): Payer: Medicaid Other

## 2023-05-25 ENCOUNTER — Other Ambulatory Visit: Payer: Medicaid Other

## 2023-05-25 ENCOUNTER — Ambulatory Visit (INDEPENDENT_AMBULATORY_CARE_PROVIDER_SITE_OTHER): Payer: Medicaid Other | Admitting: Obstetrics & Gynecology

## 2023-05-25 VITALS — BP 139/89 | HR 84 | Wt 179.8 lb

## 2023-05-25 DIAGNOSIS — Z3A27 27 weeks gestation of pregnancy: Secondary | ICD-10-CM | POA: Diagnosis not present

## 2023-05-25 DIAGNOSIS — O0990 Supervision of high risk pregnancy, unspecified, unspecified trimester: Secondary | ICD-10-CM

## 2023-05-25 DIAGNOSIS — Z8279 Family history of other congenital malformations, deformations and chromosomal abnormalities: Secondary | ICD-10-CM

## 2023-05-25 DIAGNOSIS — O10912 Unspecified pre-existing hypertension complicating pregnancy, second trimester: Secondary | ICD-10-CM | POA: Diagnosis not present

## 2023-05-25 DIAGNOSIS — O10012 Pre-existing essential hypertension complicating pregnancy, second trimester: Secondary | ICD-10-CM

## 2023-05-25 DIAGNOSIS — Z3A26 26 weeks gestation of pregnancy: Secondary | ICD-10-CM

## 2023-05-25 DIAGNOSIS — O10919 Unspecified pre-existing hypertension complicating pregnancy, unspecified trimester: Secondary | ICD-10-CM

## 2023-05-25 DIAGNOSIS — Z131 Encounter for screening for diabetes mellitus: Secondary | ICD-10-CM

## 2023-05-25 NOTE — Progress Notes (Signed)
 HIGH-RISK PREGNANCY VISIT Patient name: Suzanne Mack MRN [id number]  Date of birth: 04-23-1996 Chief Complaint:   Routine Prenatal Visit  History of Present Illness:   Suzanne Mack is a 28 y.o. G19P2002 female at [redacted]w[redacted]d with an Estimated Date of Delivery: 08/23/23 being seen today for ongoing management of a high-risk pregnancy complicated by:  -Chronic HTN on labetalol  200 mg twice daily -Prior pregnancy with TGA  Today she reports no complaints.   Contractions: Not present. Vag. Bleeding: None.  Movement: Present. denies leaking of fluid.      11/04/2021   10:08 AM 07/14/2021    2:32 PM 05/15/2021    1:32 PM  Depression screen PHQ 2/9  Decreased Interest 0 2 1  Down, Depressed, Hopeless 0 0 1  PHQ - 2 Score 0 2 2  Altered sleeping 0 0 0  Tired, decreased energy 2 1 1   Change in appetite 0 0 0  Feeling bad or failure about yourself  0 0 1  Trouble concentrating 0 0 0  Moving slowly or fidgety/restless 0 0 0  Suicidal thoughts 0 0 0  PHQ-9 Score 2 3 4      Current Outpatient Medications  Medication Instructions   Acetaminophen  (TYLENOL  PO) Take by mouth.   aspirin  EC 162 mg, Oral, Daily, Swallow whole.   labetalol  (NORMODYNE ) 200 mg, Oral, 2 times daily   Prenatal Vit-Fe Fumarate-FA (PRENATAL VITAMIN PO) Take by mouth.     Review of Systems:   Pertinent items are noted in HPI Denies abnormal vaginal discharge w/ itching/odor/irritation, headaches, visual changes, shortness of breath, chest pain, abdominal pain, severe nausea/vomiting, or problems with urination or bowel movements unless otherwise stated above. Pertinent History Reviewed:  Reviewed past medical,surgical, social, obstetrical and family history.  Reviewed problem list, medications and allergies. Physical Assessment:   Vitals:   05/25/23 1135  BP: 139/89  Pulse: 84  Weight: 179 lb 12.8 oz (81.6 kg)  Body mass index is 31.85 kg/m.           Physical Examination:   General appearance:  alert, well appearing, and in no distress  Mental status: normal mood, behavior, speech, dress, motor activity, and thought processes  Skin: warm & dry   Extremities:      Cardiovascular: normal heart rate noted  Respiratory: normal respiratory effort, no distress  Abdomen: gravid, soft, non-tender  Pelvic: Cervical exam deferred         Fetal Status:     Movement: Present    Fetal Surveillance Testing today: frank breech,fundal placenta gr 1,CX 2.9 cm,FHR 150 bpm,AFI 23 cm,EFW 1184 g 77%    Chaperone: N/A    No results found for this or any previous visit (from the past 24 hours).   Assessment & Plan:  High-risk pregnancy: G3P2002 at [redacted]w[redacted]d with an Estimated Date of Delivery: 08/23/23   1) Chronic HTN -No change to current medication -AGA today, continue q 4wks -antepartum testing @ 32wk -Encourage patient to continue checking at home  2) son with TGA -Referral created for ECHO  Meds: No orders of the defined types were placed in this encounter.   Labs/procedures today: growth scan  Treatment Plan:  routine OB care and as outlined above  Reviewed: Preterm labor symptoms and general obstetric precautions including but not limited to vaginal bleeding, contractions, leaking of fluid and fetal movement were reviewed in detail with the patient.  All questions were answered. Pt has home bp cuff. Check bp weekly, let us  know if >  140/90.   Follow-up: Return for PN2 next available, 4wk HROB visit and growth scan (every 4 wk), in 5 wk (32wk) twice weekly BPP/NST.   Future Appointments  Date Time Provider Department Center  05/31/2023  8:30 AM [facility] LAB [facility]  06/22/2023 10:00 AM [facility] - FT IMG 2 [facility] None  06/22/2023 10:50 AM Kizzie Suzen SAUNDERS, CNM [facility]    Orders Placed This Encounter  Procedures   US  OB Follow Up   US  Fetal BPP W/O Non Stress   US  UA Cord Doppler    Khair Chasteen, DO Attending Obstetrician & Gynecologist, [facility]  for [facility], [facility]

## 2023-05-25 NOTE — Progress Notes (Signed)
 [place] 27+1 wks,frank breech,fundal placenta gr 1,CX 2.9 cm,FHR 150 bpm,AFI 23 cm,EFW 1184 g 77%

## 2023-05-26 ENCOUNTER — Encounter: Payer: Self-pay | Admitting: Obstetrics & Gynecology

## 2023-05-27 ENCOUNTER — Other Ambulatory Visit: Payer: Medicaid Other

## 2023-05-27 DIAGNOSIS — O0990 Supervision of high risk pregnancy, unspecified, unspecified trimester: Secondary | ICD-10-CM | POA: Diagnosis not present

## 2023-05-27 DIAGNOSIS — Z131 Encounter for screening for diabetes mellitus: Secondary | ICD-10-CM | POA: Diagnosis not present

## 2023-05-27 DIAGNOSIS — Z3A26 26 weeks gestation of pregnancy: Secondary | ICD-10-CM | POA: Diagnosis not present

## 2023-05-28 ENCOUNTER — Encounter: Payer: Self-pay | Admitting: Obstetrics & Gynecology

## 2023-05-29 LAB — GLUCOSE TOLERANCE, 2 HOURS W/ 1HR
Glucose, 1 hour: 211 mg/dL — ABNORMAL HIGH (ref 70–179)
Glucose, 2 hour: 213 mg/dL — ABNORMAL HIGH (ref 70–152)
Glucose, Fasting: 104 mg/dL — ABNORMAL HIGH (ref 70–91)

## 2023-05-29 LAB — CBC
Hematocrit: 39.1 % (ref 34.0–46.6)
Hemoglobin: 12.7 g/dL (ref 11.1–15.9)
MCH: 29.1 pg (ref 26.6–33.0)
MCHC: 32.5 g/dL (ref 31.5–35.7)
MCV: 90 fL (ref 79–97)
Platelets: 181 10*3/uL (ref 150–450)
RBC: 4.37 x10E6/uL (ref 3.77–5.28)
RDW: 12.6 % (ref 11.7–15.4)
WBC: 10.7 10*3/uL (ref 3.4–10.8)

## 2023-05-29 LAB — RPR: RPR Ser Ql: NONREACTIVE

## 2023-05-29 LAB — ANTIBODY SCREEN: Antibody Screen: NEGATIVE

## 2023-05-29 LAB — HIV ANTIBODY (ROUTINE TESTING W REFLEX)

## 2023-05-31 ENCOUNTER — Other Ambulatory Visit: Payer: Self-pay | Admitting: Advanced Practice Midwife

## 2023-05-31 ENCOUNTER — Other Ambulatory Visit: Payer: Medicaid Other

## 2023-05-31 ENCOUNTER — Encounter: Payer: Self-pay | Admitting: *Deleted

## 2023-05-31 DIAGNOSIS — O24410 Gestational diabetes mellitus in pregnancy, diet controlled: Secondary | ICD-10-CM

## 2023-05-31 DIAGNOSIS — Z8632 Personal history of gestational diabetes: Secondary | ICD-10-CM | POA: Insufficient documentation

## 2023-05-31 DIAGNOSIS — O24419 Gestational diabetes mellitus in pregnancy, unspecified control: Secondary | ICD-10-CM | POA: Insufficient documentation

## 2023-05-31 MED ORDER — ACCU-CHEK SOFTCLIX LANCETS MISC: 12 refills | Status: DC

## 2023-05-31 MED ORDER — GLUCOSE BLOOD VI STRP: ORAL_STRIP | 12 refills | Status: AC

## 2023-06-15 ENCOUNTER — Encounter: Payer: Self-pay | Admitting: Obstetrics & Gynecology

## 2023-06-15 ENCOUNTER — Ambulatory Visit (INDEPENDENT_AMBULATORY_CARE_PROVIDER_SITE_OTHER): Payer: Medicaid Other | Admitting: Obstetrics & Gynecology

## 2023-06-15 VITALS — BP 132/89 | HR 105 | Wt 177.8 lb

## 2023-06-15 DIAGNOSIS — O0990 Supervision of high risk pregnancy, unspecified, unspecified trimester: Secondary | ICD-10-CM

## 2023-06-15 DIAGNOSIS — O10913 Unspecified pre-existing hypertension complicating pregnancy, third trimester: Secondary | ICD-10-CM

## 2023-06-15 DIAGNOSIS — O10919 Unspecified pre-existing hypertension complicating pregnancy, unspecified trimester: Secondary | ICD-10-CM

## 2023-06-15 DIAGNOSIS — Z91199 Patient's noncompliance with other medical treatment and regimen due to unspecified reason: Secondary | ICD-10-CM

## 2023-06-15 DIAGNOSIS — Z3A30 30 weeks gestation of pregnancy: Secondary | ICD-10-CM

## 2023-06-15 DIAGNOSIS — O09893 Supervision of other high risk pregnancies, third trimester: Secondary | ICD-10-CM

## 2023-06-15 DIAGNOSIS — O0993 Supervision of high risk pregnancy, unspecified, third trimester: Secondary | ICD-10-CM

## 2023-06-15 DIAGNOSIS — O24410 Gestational diabetes mellitus in pregnancy, diet controlled: Secondary | ICD-10-CM

## 2023-06-15 NOTE — Progress Notes (Signed)
HIGH-RISK PREGNANCY VISIT Patient name: Suzanne Mack MRN [id number]  Date of birth: 01-18-1996 Chief Complaint:   Routine Prenatal Visit  History of Present Illness:   Suzanne Mack is a 28 y.o. G88P2002 female at [redacted]w[redacted]d with an Estimated Date of Delivery: 08/23/23 being seen today for ongoing management of a high-risk pregnancy complicated by:  -Chronic HTN-patient supposed to be taking labetalol 200 mg twice daily Today she notes that she has not been taking the medication for the past month Reports that the medicine made her sick States she checks her BP at home, reported reading of 98/90 Not taking aspirin  -GDMA1 Not checking her sugars, doesn't want to prick her finger so often   Today she reports  recent GI bug from her kids- doing better now .   Contractions: Not present. Vag. Bleeding: None.  Movement: Present. denies leaking of fluid.      11/04/2021   10:08 AM 07/14/2021    2:32 PM 05/15/2021    1:32 PM  Depression screen PHQ 2/9  Decreased Interest 0 2 1  Down, Depressed, Hopeless 0 0 1  PHQ - 2 Score 0 2 2  Altered sleeping 0 0 0  Tired, decreased energy 2 1 1   Change in appetite 0 0 0  Feeling bad or failure about yourself  0 0 1  Trouble concentrating 0 0 0  Moving slowly or fidgety/restless 0 0 0  Suicidal thoughts 0 0 0  PHQ-9 Score 2 3 4      Current Outpatient Medications  Medication Instructions   Accu-Chek Softclix Lancets lancets Use as instructed to check blood sugar 4 times daily   Acetaminophen (TYLENOL PO) Take by mouth.   aspirin EC 162 mg, Oral, Daily, Swallow whole.   glucose blood test strip Use as instructed to check blood sugar four times daily   labetalol (NORMODYNE) 200 mg, Oral, 2 times daily   Prenatal Vit-Fe Fumarate-FA (PRENATAL VITAMIN PO) Take by mouth.     Review of Systems:   Pertinent items are noted in HPI Denies abnormal vaginal discharge w/ itching/odor/irritation, headaches, visual changes, shortness of breath,  chest pain, abdominal pain, severe nausea/vomiting, or problems with urination or bowel movements unless otherwise stated above. Pertinent History Reviewed:  Reviewed past medical,surgical, social, obstetrical and family history.  Reviewed problem list, medications and allergies. Physical Assessment:   Vitals:   06/15/23 0905  BP: 132/89  Pulse: (!) 105  Weight: 177 lb 12.8 oz (80.6 kg)  Body mass index is 31.5 kg/m.           Physical Examination:   General appearance: alert, well appearing, and in no distress  Mental status: normal mood, behavior, speech, dress, motor activity, and thought processes  Skin: warm & dry   Extremities:   no edema   Cardiovascular: normal heart rate noted  Respiratory: normal respiratory effort, no distress  Abdomen: gravid, soft, non-tender  Pelvic: Cervical exam deferred         Fetal Status: Fetal Heart Rate (bpm): 140 Fundal Height: 29 cm Movement: Present    Fetal Surveillance Testing today: doppler   Chaperone: N/A    No results found for this or any previous visit (from the past 24 hours).   Assessment & Plan:  High-risk pregnancy: G3P2002 at [redacted]w[redacted]d with an Estimated Date of Delivery: 08/23/23   1) Chronic HTN -Suspect home BP cuff inaccurate, []  pt to bring home BP cuff next visit -Reviewed the "why "of treatment, if BPs with diastolic  greater than 90 would advise patient to restart labetalol, which it seems like she is willing to do.  Also discussed alternative medications []  plan to f/u next week -growth scan scheduled and continue q 4wks -antepartum testing ordered/scheduled  2) Gestational DM -given log sheet -Encourage patient to consider at a minimum every other day  Meds: No orders of the defined types were placed in this encounter.   Labs/procedures today: doppler  Treatment Plan:  routine OB care and as outlined above  Reviewed: Preterm labor symptoms and general obstetric precautions including but not limited to  vaginal bleeding, contractions, leaking of fluid and fetal movement were reviewed in detail with the patient.  All questions were answered. Pt has home bp cuff. Check bp weekly, let us know if >140/90.   Follow-up: Return for scheduled next week.   Future Appointments  Date Time Provider Department Center  06/22/2023 10:00 AM [facility] 2 [facility] None  06/22/2023 10:50 AM Cheral Marker, CNM [facility]  06/29/2023 10:00 AM [facility] [place] [facility] None  06/29/2023 10:50 AM Cheral Marker, CNM [facility]  07/02/2023 10:10 AM [facility] NURSE [facility]  07/06/2023  9:15 AM [facility] FT IMG 2 [facility] None  07/06/2023 10:10 AM Lazaro Arms, MD [facility]  07/09/2023  9:50 AM [facility] NURSE [facility]  07/13/2023  9:15 AM [facility] - FT IMG 2 [facility] None  07/13/2023 10:10 AM Lazaro Arms, MD [facility]  07/16/2023 10:30 AM [facility] NURSE [facility]  07/20/2023  9:15 AM [facility] - [facility] [place] [facility] None  07/20/2023 10:10 AM Cheral Marker, CNM [facility]  07/23/2023 10:10 AM [facility] NURSE [facility]  07/27/2023  9:15 AM [facility] - FT IMG 2 [facility] None  07/27/2023 10:10 AM Cheral Marker, CNM [facility]  07/30/2023 10:10 AM [facility] NURSE [facility]  08/03/2023  9:15 AM [facility] - [facility] [place] [facility] None  08/03/2023 10:10 AM Myna Hidalgo, DO [facility]  08/06/2023 10:10 AM [facility] NURSE [facility]  08/10/2023  9:15 AM [facility] - FT IMG 2 [facility] None  08/10/2023 10:10 AM Myna Hidalgo, DO [facility]  08/13/2023  9:50 AM [facility] NURSE [facility]  08/17/2023  9:15 AM [facility] - FT IMG 2 [facility] None  08/20/2023  9:30 AM [facility] NURSE [facility]    No orders of the defined types were placed in this encounter.   Myna Hidalgo, DO Attending Obstetrician & Gynecologist, [facility] for [employer], [facility]

## 2023-06-19 DIAGNOSIS — Z419 Encounter for procedure for purposes other than remedying health state, unspecified: Secondary | ICD-10-CM | POA: Diagnosis not present

## 2023-06-22 ENCOUNTER — Encounter: Payer: Self-pay | Admitting: *Deleted

## 2023-06-22 ENCOUNTER — Ambulatory Visit: Payer: Medicaid Other | Admitting: Radiology

## 2023-06-22 ENCOUNTER — Encounter: Payer: Self-pay | Admitting: Women's Health

## 2023-06-22 ENCOUNTER — Ambulatory Visit: Payer: Medicaid Other | Admitting: Women's Health

## 2023-06-22 VITALS — BP 131/83 | HR 88 | Wt 180.4 lb

## 2023-06-22 DIAGNOSIS — Z3A31 31 weeks gestation of pregnancy: Secondary | ICD-10-CM

## 2023-06-22 DIAGNOSIS — O0993 Supervision of high risk pregnancy, unspecified, third trimester: Secondary | ICD-10-CM

## 2023-06-22 DIAGNOSIS — Z8279 Family history of other congenital malformations, deformations and chromosomal abnormalities: Secondary | ICD-10-CM

## 2023-06-22 DIAGNOSIS — O24410 Gestational diabetes mellitus in pregnancy, diet controlled: Secondary | ICD-10-CM | POA: Diagnosis not present

## 2023-06-22 DIAGNOSIS — O10913 Unspecified pre-existing hypertension complicating pregnancy, third trimester: Secondary | ICD-10-CM

## 2023-06-22 DIAGNOSIS — O0990 Supervision of high risk pregnancy, unspecified, unspecified trimester: Secondary | ICD-10-CM

## 2023-06-22 DIAGNOSIS — O10919 Unspecified pre-existing hypertension complicating pregnancy, unspecified trimester: Secondary | ICD-10-CM

## 2023-06-22 NOTE — Patient Instructions (Signed)
 Suzanne Mack, thank you for choosing our office today! We appreciate the opportunity to meet your healthcare needs. You may receive a short survey by mail, e-mail, or through [employer]. If you are happy with your care we would appreciate if you could take just a few minutes to complete the survey questions. We read all of your comments and take your feedback very seriously. Thank you again for choosing our office.  Center for [employer] Team at [facility]  [facility] at [facility] ([address] [place], [place] [address]) Entrance C, located off of [address] Free 24/7 valet parking   CLASSES: Go to [email or web address] to register for classes (childbirth, breastfeeding, waterbirth, infant CPR, daddy bootcamp, etc.)  Call the office [phone]) or go to [facility] if: You begin to have strong, frequent contractions Your water breaks.  Sometimes it is a big gush of fluid, sometimes it is just a trickle that keeps getting your panties wet or running down your legs You have vaginal bleeding.  It is normal to have a small amount of spotting if your cervix was checked.  You don't feel your baby moving like normal.  If you don't, get you something to eat and drink and lay down and focus on feeling your baby move.   If your baby is still not moving like normal, you should call the office or go to [facility].  Call the office [phone]) or go to [facility] hospital for these signs of pre-eclampsia: Severe headache that does not go away with Tylenol Visual changes- seeing spots, double, blurred vision Pain under your right breast or upper abdomen that does not go away with Tums or heartburn medicine Nausea and/or vomiting Severe swelling in your hands, feet, and face   Tdap Vaccine It is recommended that you get the Tdap vaccine during the third trimester of EACH pregnancy to help protect your baby from getting pertussis (whooping cough) 27-36 weeks is the BEST time to do  this so that you can pass the protection on to your baby. During pregnancy is better than after pregnancy, but if you are unable to get it during pregnancy it will be offered at the hospital.  You can get this vaccine with [place], at the health department, your family doctor, or some local pharmacies Everyone who will be around your baby should also be up-to-date on their vaccines before the baby comes. Adults (who are not pregnant) only need 1 dose of Tdap during adulthood.   [facility] Pediatricians/Family Doctors [facility]): [address] Colette Ribas, [phone]           [facility]: [address], [phone]                [facility]): [address], [phone] (call to ask if accepting patients) [facility] Department: [address], [place], [phone]    [facility] Pediatricians/Family Doctors [facility]): [phone] [address], [phone] [facility]: [address] [place], [phone] [facility]: [address], [phone]  [facility] Doctors  [facility]): [phone] [facility]: [address] [phone]   [facility] Doctors [facility]: [address], [phone]  [facility] Family Doctors  [facility]: [phone], [phone]  Home Blood Pressure Monitoring for Patients   Your provider has recommended that you check your  blood pressure (BP) at least once a week at home. If you do not have a blood pressure cuff at home, one will be provided for you. Contact your provider if you have not received your monitor within 1 week.   Helpful Tips for Accurate Home Blood Pressure Checks  Don't smoke, exercise, or drink caffeine 30 minutes before checking your BP Use the restroom before checking your BP (a full bladder can raise your  pressure) Relax in a comfortable upright chair Feet on the ground Left arm resting comfortably on a flat surface at the level of your heart Legs uncrossed Back supported Sit quietly and don't talk Place the cuff on your bare arm Adjust snuggly, so that only two fingertips can fit between your skin and the top of the cuff Check 2 readings separated by at least one minute Keep a log of your BP readings For a visual, please reference this diagram: http://ccnc.care/bpdiagram  Provider Name: [facility]     Phone: [phone]  Zone 1: ALL CLEAR  Continue to monitor your symptoms:  BP reading is less than 140 (top number) or less than 90 (bottom number)  No right upper stomach pain No headaches or seeing spots No feeling nauseated or throwing up No swelling in face and hands  Zone 2: CAUTION Call your doctor's office for any of the following:  BP reading is greater than 140 (top number) or greater than 90 (bottom number)  Stomach pain under your ribs in the middle or right side Headaches or seeing spots Feeling nauseated or throwing up Swelling in face and hands  Zone 3: EMERGENCY  Seek immediate medical care if you have any of the following:  BP reading is greater than160 (top number) or greater than 110 (bottom number) Severe headaches not improving with Tylenol Serious difficulty catching your breath Any worsening symptoms from Zone 2  Preterm Labor and Birth Information  The normal length of a pregnancy is 39-41 weeks. Preterm labor is when labor starts before 37 completed weeks of pregnancy. What are the risk factors for preterm labor? Preterm labor is more likely to occur in women who: Have certain infections during pregnancy such as a bladder infection, sexually transmitted infection, or infection inside the uterus (chorioamnionitis). Have a shorter-than-normal cervix. Have gone into preterm labor before. Have had surgery on their cervix. Are younger than age 68  or older than age 60. Are African American. Are pregnant with twins or multiple babies (multiple gestation). Take street drugs or smoke while pregnant. Do not gain enough weight while pregnant. Became pregnant shortly after having been pregnant. What are the symptoms of preterm labor? Symptoms of preterm labor include: Cramps similar to those that can happen during a menstrual period. The cramps may happen with diarrhea. Pain in the abdomen or lower back. Regular uterine contractions that may feel like tightening of the abdomen. A feeling of increased pressure in the pelvis. Increased watery or bloody mucus discharge from the vagina. Water breaking (ruptured amniotic sac). Why is it important to recognize signs of preterm labor? It is important to recognize signs of preterm labor because babies who are born prematurely may not be fully developed. This can put them at an increased risk for: Long-term (chronic) heart and lung problems. Difficulty immediately after birth with regulating body systems, including blood sugar, body temperature, heart rate, and breathing rate. Bleeding in the brain. Cerebral palsy. Learning difficulties. Death. These risks are highest for babies who are born before 34 weeks  of pregnancy. How is preterm labor treated? Treatment depends on the length of your pregnancy, your condition, and the health of your baby. It may involve: Having a stitch (suture) placed in your cervix to prevent your cervix from opening too early (cerclage). Taking or being given medicines, such as: Hormone medicines. These may be given early in pregnancy to help support the pregnancy. Medicine to stop contractions. Medicines to help mature the baby's lungs. These may be prescribed if the risk of delivery is high. Medicines to prevent your baby from developing cerebral palsy. If the labor happens before 34 weeks of pregnancy, you may need to stay in the hospital. What should I do if I  think I am in preterm labor? If you think that you are going into preterm labor, call your health care provider right away. How can I prevent preterm labor in future pregnancies? To increase your chance of having a full-term pregnancy: Do not use any tobacco products, such as cigarettes, chewing tobacco, and e-cigarettes. If you need help quitting, ask your health care provider. Do not use street drugs or medicines that have not been prescribed to you during your pregnancy. Talk with your health care provider before taking any herbal supplements, even if you have been taking them regularly. Make sure you gain a healthy amount of weight during your pregnancy. Watch for infection. If you think that you might have an infection, get it checked right away. Make sure to tell your health care provider if you have gone into preterm labor before. This information is not intended to replace advice given to you by your health care provider. Make sure you discuss any questions you have with your health care provider. Document Revised: 08/26/2018 Document Reviewed: 09/25/2015 Elsevier Patient Education  2020 [employer].

## 2023-06-22 NOTE — Progress Notes (Signed)
 HIGH-RISK PREGNANCY VISIT Patient name: Suzanne Mack MRN [id number]  Date of birth: December 27, 1995 Chief Complaint:   Routine Prenatal Visit  History of Present Illness:   Suzanne Mack is a 28 y.o. G67P2002 female at [redacted]w[redacted]d with an Estimated Date of Delivery: 08/23/23 being seen today for ongoing management of a high-risk pregnancy complicated by [facility] on labetalol  200mg  BID (not taking) and A1DM.    Today she reports  didn't bring log, hasn't heard from dietician. Fastings all <=95, 2hr pp majority >120 w/ highest 160. Drinking regular Gatorade, sodas, juice . Not taking labetalol , says home bp's have been low. Never picked up ASA. Hasn't heard from anyone about scheduling echo.  Contractions: Not present. Vag. Bleeding: None.  Movement: Present. denies leaking of fluid.      11/04/2021   10:08 AM 07/14/2021    2:32 PM 05/15/2021    1:32 PM  Depression screen PHQ 2/9  Decreased Interest 0 2 1  Down, Depressed, Hopeless 0 0 1  PHQ - 2 Score 0 2 2  Altered sleeping 0 0 0  Tired, decreased energy 2 1 1   Change in appetite 0 0 0  Feeling bad or failure about yourself  0 0 1  Trouble concentrating 0 0 0  Moving slowly or fidgety/restless 0 0 0  Suicidal thoughts 0 0 0  PHQ-9 Score 2 3 4         11/04/2021   10:08 AM 07/14/2021    2:33 PM 05/15/2021    1:32 PM  GAD 7 : Generalized Anxiety Score  Nervous, Anxious, on Edge 0 1 0  Control/stop worrying 0 0 0  Worry too much - different things 0 0 0  Trouble relaxing 0 1 0  Restless 0 0 0  Easily annoyed or irritable 0 2 2  Afraid - awful might happen 0 1 1  Total GAD 7 Score 0 5 3     Review of Systems:   Pertinent items are noted in HPI Denies abnormal vaginal discharge w/ itching/odor/irritation, headaches, visual changes, shortness of breath, chest pain, abdominal pain, severe nausea/vomiting, or problems with urination or bowel movements unless otherwise stated above. Pertinent History Reviewed:  Reviewed past  medical,surgical, social, obstetrical and family history.  Reviewed problem list, medications and allergies. Physical Assessment:   Vitals:   06/22/23 1038  BP: 131/83  Pulse: 88  Weight: 180 lb 6.4 oz (81.8 kg)  Body mass index is 31.96 kg/m.           Physical Examination:   General appearance: alert, well appearing, and in no distress  Mental status: alert, oriented to person, place, and time  Skin: warm & dry   Extremities:      Cardiovascular: normal heart rate noted  Respiratory: normal respiratory effort, no distress  Abdomen: gravid, soft, non-tender  Pelvic: Cervical exam deferred         Fetal Status:     Movement: Present    Fetal Surveillance Testing today:  US :  GA = 31+1 weeks Single active female fetus, cepahlic, FHR = 150 bpm,  posterior pl, gr2 AFI = 13.9 cm  44%,  MVP = 4.4 cm  EFW 46%  1757g  BPP = 8/8 RI: 0.66, 0.71  61% Cervix appears closed - normal ov's    Chaperone: N/A    No results found for this or any previous visit (from the past 24 hours).  Assessment & Plan:  High-risk pregnancy: G3P2002 at [redacted]w[redacted]d with an Estimated Date of  Delivery: 08/23/23   1) CHTN, not taking labetalol  or ASA, bp stable right now  2) A1DM, hasn't heard from dietician, 2hrs are elevated. Note routed to [facility] to check on dietician referral, needs asap. Discussed decreasing carbs/sugary drinks, increasing protein. If still elevated after seeing dietician will need to start meds. Discussed importance of glycemic control. EFW today 46% w/ normal AFI  3) Son w/ TGA> fetal echo order form faxed today  Meds: No orders of the defined types were placed in this encounter.  Labs/procedures today: u/s, declined RSV vaccine  Treatment Plan:  weekly bpp/dopp, add NST @ 32wk, EFW q4w, IOL 37-39  Reviewed: Preterm labor symptoms and general obstetric precautions including but not limited to vaginal bleeding, contractions, leaking of fluid and fetal movement were reviewed in detail with  the patient.  All questions were answered. Does have home bp cuff. Office bp cuff given: not applicable. Check bp daily, let us  know if consistently >140 and/or >90.  Follow-up: Return for As scheduled.   Future Appointments  Date Time Provider Department Center  06/29/2023 10:00 AM [facility] US  [facility] None  06/29/2023 10:50 AM Kizzie Suzen SAUNDERS, CNM [facility]  07/02/2023 10:10 AM [facility] NURSE [facility]  07/06/2023  9:15 AM [facility] IMG 2 [facility] None  07/06/2023 10:10 AM Jayne Vonn DEL, MD [facility]  07/09/2023  9:50 AM [facility] NURSE [facility]  07/13/2023  9:15 AM [facility] IMG 2 [facility] None  07/13/2023 10:10 AM Jayne Vonn DEL, MD [facility]  07/16/2023 10:30 AM [facility] NURSE [facility]  07/20/2023  9:15 AM [facility] - FTOBGYN US  [facility] None  07/20/2023 10:10 AM Kizzie Suzen SAUNDERS, CNM [facility]  07/23/2023 10:10 AM [facility] NURSE [facility]  07/27/2023  9:15 AM [facility] IMG 2 [facility] None  07/27/2023 10:10 AM Kizzie Suzen SAUNDERS, CNM [facility]  07/30/2023 10:10 AM [facility] NURSE [facility]  08/03/2023  9:15 AM [facility] - FTOBGYN US  [facility] None  08/03/2023 10:10 AM Marilynn Nest, DO [facility]  08/06/2023 10:10 AM [facility] NURSE [facility]  08/10/2023  9:15 AM [facility] FT IMG 2 [facility] None  08/10/2023 10:10 AM Marilynn Nest, DO [facility]TO[facility]  08/13/2023  9:50 AM [facility] NURSE [facility]  08/17/2023  9:15 AM [facility] FT IMG 2 [facility] None  08/20/2023  9:30 AM [facility] NURSE [facility]    No orders of the defined types were placed in this encounter.  Suzen SAUNDERS Kizzie CNM, [facility] 06/22/2023 12:38 PM

## 2023-06-22 NOTE — Progress Notes (Signed)
[place]:  GA = 31+1 weeks Single active female fetus, cepahlic, FHR = 150 bpm,  posterior pl, gr2 AFI = 13.9 cm  44%,  MVP = 4.4 cm  EFW 46%  1757g  BPP = 8/8 RI: 0.66, 0.71  61% Cervix appears closed - normal ov's

## 2023-06-29 ENCOUNTER — Encounter: Payer: Self-pay | Admitting: Women's Health

## 2023-06-29 ENCOUNTER — Ambulatory Visit (INDEPENDENT_AMBULATORY_CARE_PROVIDER_SITE_OTHER): Payer: Medicaid Other | Admitting: Women's Health

## 2023-06-29 ENCOUNTER — Other Ambulatory Visit: Payer: Self-pay | Admitting: Obstetrics & Gynecology

## 2023-06-29 ENCOUNTER — Ambulatory Visit: Payer: Medicaid Other

## 2023-06-29 VITALS — BP 125/87 | HR 88 | Wt 180.0 lb

## 2023-06-29 DIAGNOSIS — O24419 Gestational diabetes mellitus in pregnancy, unspecified control: Secondary | ICD-10-CM

## 2023-06-29 DIAGNOSIS — O10919 Unspecified pre-existing hypertension complicating pregnancy, unspecified trimester: Secondary | ICD-10-CM

## 2023-06-29 DIAGNOSIS — O0993 Supervision of high risk pregnancy, unspecified, third trimester: Secondary | ICD-10-CM

## 2023-06-29 DIAGNOSIS — Z3A32 32 weeks gestation of pregnancy: Secondary | ICD-10-CM | POA: Diagnosis not present

## 2023-06-29 DIAGNOSIS — O0990 Supervision of high risk pregnancy, unspecified, unspecified trimester: Secondary | ICD-10-CM

## 2023-06-29 DIAGNOSIS — O10913 Unspecified pre-existing hypertension complicating pregnancy, third trimester: Secondary | ICD-10-CM | POA: Diagnosis not present

## 2023-06-29 DIAGNOSIS — Z8279 Family history of other congenital malformations, deformations and chromosomal abnormalities: Secondary | ICD-10-CM

## 2023-06-29 DIAGNOSIS — O09299 Supervision of pregnancy with other poor reproductive or obstetric history, unspecified trimester: Secondary | ICD-10-CM

## 2023-06-29 DIAGNOSIS — Z3A33 33 weeks gestation of pregnancy: Secondary | ICD-10-CM | POA: Diagnosis not present

## 2023-06-29 DIAGNOSIS — Z3A27 27 weeks gestation of pregnancy: Secondary | ICD-10-CM

## 2023-06-29 MED ORDER — METFORMIN HCL 500 MG PO TABS: 500.0000 mg | ORAL_TABLET | Freq: Every day | ORAL | 3 refills | Status: AC

## 2023-06-29 NOTE — Progress Notes (Signed)
[place] 32+3 wks,cephalic,posterior placenta gr 2,BPP 8/8,RI .71,.75,.73=92%,AFI 17 cm

## 2023-06-29 NOTE — Patient Instructions (Signed)
Toni Amend, thank you for choosing our office today! We appreciate the opportunity to meet your healthcare needs. You may receive a short survey by mail, e-mail, or through [employer]. If you are happy with your care we would appreciate if you could take just a few minutes to complete the survey questions. We read all of your comments and take your feedback very seriously. Thank you again for choosing our office.  Center for [employer] Team at [facility]  [facility] at [facility] ([address] [place], [place] [address]) Entrance C, located off of [address] Free 24/7 valet parking   CLASSES: Go to [email or web address] to register for classes (childbirth, breastfeeding, waterbirth, infant CPR, daddy bootcamp, etc.)  Call the office [phone]) or go to [facility] if: You begin to have strong, frequent contractions Your water breaks.  Sometimes it is a big gush of fluid, sometimes it is just a trickle that keeps getting your panties wet or running down your legs You have vaginal bleeding.  It is normal to have a small amount of spotting if your cervix was checked.  You don't feel your baby moving like normal.  If you don't, get you something to eat and drink and lay down and focus on feeling your baby move.   If your baby is still not moving like normal, you should call the office or go to [facility].  Call the office [phone]) or go to [facility] hospital for these signs of pre-eclampsia: Severe headache that does not go away with Tylenol Visual changes- seeing spots, double, blurred vision Pain under your right breast or upper abdomen that does not go away with Tums or heartburn medicine Nausea and/or vomiting Severe swelling in your hands, feet, and face   Tdap Vaccine It is recommended that you get the Tdap vaccine during the third trimester of EACH pregnancy to help protect your baby from getting pertussis (whooping cough) 27-36 weeks is the BEST time to do  this so that you can pass the protection on to your baby. During pregnancy is better than after pregnancy, but if you are unable to get it during pregnancy it will be offered at the hospital.  You can get this vaccine with [place], at the health department, your family doctor, or some local pharmacies Everyone who will be around your baby should also be up-to-date on their vaccines before the baby comes. Adults (who are not pregnant) only need 1 dose of Tdap during adulthood.   [facility] Pediatricians/Family Doctors [facility]): [address] Colette Ribas, [phone]           [facility]: [address], [phone]                [facility]): [address], [phone] (call to ask if accepting patients) [facility] Department: [address], [place], [phone]    [facility] Pediatricians/Family Doctors [facility]): [phone] [address], [phone] [facility]: [address] [place], [phone] [facility] [place]: [address], [phone]  [facility] Doctors  [facility]): [phone] [facility]: [address] [phone]   [facility] Doctors [facility]: [address], [phone]  [facility] Family Doctors  [facility]: [phone], [phone]  Home Blood Pressure Monitoring for Patients   Your provider has recommended that you check your  blood pressure (BP) at least once a week at home. If you do not have a blood pressure cuff at home, one will be provided for you. Contact your provider if you have not received your monitor within 1 week.   Helpful Tips for Accurate Home Blood Pressure Checks  Don't smoke, exercise, or drink caffeine 30 minutes before checking your BP Use the restroom before checking your BP (a full bladder can raise your  pressure) Relax in a comfortable upright chair Feet on the ground Left arm resting comfortably on a flat surface at the level of your heart Legs uncrossed Back supported Sit quietly and don't talk Place the cuff on your bare arm Adjust snuggly, so that only two fingertips can fit between your skin and the top of the cuff Check 2 readings separated by at least one minute Keep a log of your BP readings For a visual, please reference this diagram: http://ccnc.care/bpdiagram  Provider Name: [facility]     Phone: [phone]  Zone 1: ALL CLEAR  Continue to monitor your symptoms:  BP reading is less than 140 (top number) or less than 90 (bottom number)  No right upper stomach pain No headaches or seeing spots No feeling nauseated or throwing up No swelling in face and hands  Zone 2: CAUTION Call your doctor's office for any of the following:  BP reading is greater than 140 (top number) or greater than 90 (bottom number)  Stomach pain under your ribs in the middle or right side Headaches or seeing spots Feeling nauseated or throwing up Swelling in face and hands  Zone 3: EMERGENCY  Seek immediate medical care if you have any of the following:  BP reading is greater than160 (top number) or greater than 110 (bottom number) Severe headaches not improving with Tylenol Serious difficulty catching your breath Any worsening symptoms from Zone 2  Preterm Labor and Birth Information  The normal length of a pregnancy is 39-41 weeks. Preterm labor is when labor starts before 37 completed weeks of pregnancy. What are the risk factors for preterm labor? Preterm labor is more likely to occur in women who: Have certain infections during pregnancy such as a bladder infection, sexually transmitted infection, or infection inside the uterus (chorioamnionitis). Have a shorter-than-normal cervix. Have gone into preterm labor before. Have had surgery on their cervix. Are younger than age 68  or older than age 60. Are African American. Are pregnant with twins or multiple babies (multiple gestation). Take street drugs or smoke while pregnant. Do not gain enough weight while pregnant. Became pregnant shortly after having been pregnant. What are the symptoms of preterm labor? Symptoms of preterm labor include: Cramps similar to those that can happen during a menstrual period. The cramps may happen with diarrhea. Pain in the abdomen or lower back. Regular uterine contractions that may feel like tightening of the abdomen. A feeling of increased pressure in the pelvis. Increased watery or bloody mucus discharge from the vagina. Water breaking (ruptured amniotic sac). Why is it important to recognize signs of preterm labor? It is important to recognize signs of preterm labor because babies who are born prematurely may not be fully developed. This can put them at an increased risk for: Long-term (chronic) heart and lung problems. Difficulty immediately after birth with regulating body systems, including blood sugar, body temperature, heart rate, and breathing rate. Bleeding in the brain. Cerebral palsy. Learning difficulties. Death. These risks are highest for babies who are born before 34 weeks  of pregnancy. How is preterm labor treated? Treatment depends on the length of your pregnancy, your condition, and the health of your baby. It may involve: Having a stitch (suture) placed in your cervix to prevent your cervix from opening too early (cerclage). Taking or being given medicines, such as: Hormone medicines. These may be given early in pregnancy to help support the pregnancy. Medicine to stop contractions. Medicines to help mature the baby's lungs. These may be prescribed if the risk of delivery is high. Medicines to prevent your baby from developing cerebral palsy. If the labor happens before 34 weeks of pregnancy, you may need to stay in the hospital. What should I do if I  think I am in preterm labor? If you think that you are going into preterm labor, call your health care provider right away. How can I prevent preterm labor in future pregnancies? To increase your chance of having a full-term pregnancy: Do not use any tobacco products, such as cigarettes, chewing tobacco, and e-cigarettes. If you need help quitting, ask your health care provider. Do not use street drugs or medicines that have not been prescribed to you during your pregnancy. Talk with your health care provider before taking any herbal supplements, even if you have been taking them regularly. Make sure you gain a healthy amount of weight during your pregnancy. Watch for infection. If you think that you might have an infection, get it checked right away. Make sure to tell your health care provider if you have gone into preterm labor before. This information is not intended to replace advice given to you by your health care provider. Make sure you discuss any questions you have with your health care provider. Document Revised: 08/26/2018 Document Reviewed: 09/25/2015 Elsevier Patient Education  2020 [employer].

## 2023-06-29 NOTE — Progress Notes (Signed)
HIGH-RISK PREGNANCY VISIT Patient name: Suzanne Mack MRN [id number]  Date of birth: 1995-10-09 Chief Complaint:   Routine Prenatal Visit and Pregnancy Ultrasound  History of Present Illness:   Suzanne Mack is a 28 y.o. G30P2002 female at [redacted]w[redacted]d with an Estimated Date of Delivery: 08/23/23 being seen today for ongoing management of a high-risk pregnancy complicated by chronic hypertension currently on no meds (stopped labetalol), diabetes mellitus A1DM Today she reports  fbs 90-98 (only 2 >95), 2hr pp (only checking breakfast and occ lunch- d/t work and schedule) 120-160. Sees dietician on Thurs, but has started trying to cut back some. Eats a lot of sandwiches d/t convenience for job . Contractions: Not present. Vag. Bleeding: None.  Movement: Present. denies leaking of fluid.      11/04/2021   10:08 AM 07/14/2021    2:32 PM 05/15/2021    1:32 PM  Depression screen PHQ 2/9  Decreased Interest 0 2 1  Down, Depressed, Hopeless 0 0 1  PHQ - 2 Score 0 2 2  Altered sleeping 0 0 0  Tired, decreased energy 2 1 1   Change in appetite 0 0 0  Feeling bad or failure about yourself  0 0 1  Trouble concentrating 0 0 0  Moving slowly or fidgety/restless 0 0 0  Suicidal thoughts 0 0 0  PHQ-9 Score 2 3 4         11/04/2021   10:08 AM 07/14/2021    2:33 PM 05/15/2021    1:32 PM  GAD 7 : Generalized Anxiety Score  Nervous, Anxious, on Edge 0 1 0  Control/stop worrying 0 0 0  Worry too much - different things 0 0 0  Trouble relaxing 0 1 0  Restless 0 0 0  Easily annoyed or irritable 0 2 2  Afraid - awful might happen 0 1 1  Total GAD 7 Score 0 5 3     Review of Systems:   Pertinent items are noted in HPI Denies abnormal vaginal discharge w/ itching/odor/irritation, headaches, visual changes, shortness of breath, chest pain, abdominal pain, severe nausea/vomiting, or problems with urination or bowel movements unless otherwise stated above. Pertinent History Reviewed:  Reviewed past  medical,surgical, social, obstetrical and family history.  Reviewed problem list, medications and allergies. Physical Assessment:   Vitals:   06/29/23 1059  BP: 125/87  Pulse: 88  Weight: 180 lb (81.6 kg)  Body mass index is 31.89 kg/m.           Physical Examination:   General appearance: alert, well appearing, and in no distress  Mental status: alert, oriented to person, place, and time  Skin: warm & dry   Extremities: Edema: None    Cardiovascular: normal heart rate noted  Respiratory: normal respiratory effort, no distress  Abdomen: gravid, soft, non-tender  Pelvic: Cervical exam deferred         Fetal Status:     Movement: Present    Fetal Surveillance Testing today: [place] 32+3 wks,cephalic,posterior placenta gr 2,BPP 8/8,RI .71,.75,.73=92%,AFI 17 cm   Chaperone: N/A    No results found for this or any previous visit (from the past 24 hours).  Assessment & Plan:  High-risk pregnancy: G3P2002 at [redacted]w[redacted]d with an Estimated Date of Delivery: 08/23/23   1) CHTN, not taking labetalol or ASA, bp stable. Check daily at home, if >140/90 restart labetalol and let us know. BPP 8/8, UAD 92% today  2) A2DM> start metformin 500mg  AM, appt w/ dietician 2/13, EFW 46% last week, normal  AFI. Set alarm to remind to take p lunch and supper  3) Son w/ TGA> fetal echo scheduled for 2/13 @ 10  Meds:  Meds ordered this encounter  Medications   metFORMIN (GLUCOPHAGE) 500 MG tablet    Sig: Take 1 tablet (500 mg total) by mouth daily with breakfast.    Dispense:  30 tablet    Refill:  3    Labs/procedures today: U/S  Treatment Plan:  weekly bpp/dopp, add NST @ 32wk, EFW q4w, IOL 37-39  Reviewed: Preterm labor symptoms and general obstetric precautions including but not limited to vaginal bleeding, contractions, leaking of fluid and fetal movement were reviewed in detail with the patient.  All questions were answered. Does have home bp cuff. Office bp cuff given: not applicable. Check bp daily,  let us know if consistently >140 and/or >90.  Follow-up: Return for As scheduled.   Future Appointments  Date Time Provider Department Center  07/01/2023  3:15 PM [facility]  07/02/2023 10:10 AM [facility] NURSE [facility]  07/06/2023  9:15 AM [facility] - FT IMG 2 [facility] None  07/06/2023 10:10 AM Lazaro Arms, MD [facility]  07/09/2023  9:50 AM [facility] NURSE [facility]  07/13/2023  9:15 AM [facility] - FT IMG 2 [facility]FT[facility]G None  07/13/2023 10:10 AM Lazaro Arms, MD [facility]  07/16/2023 10:30 AM [facility] NURSE [facility]  07/20/2023  9:15 AM [facility] - FTO[facility]YN [place] [facility] None  07/20/2023 10:10 AM Cheral Marker, CNM [facility]  07/23/2023 10:10 AM [facility] NURSE [facility]  07/27/2023  9:15 AM [facility] - FT IMG 2 [facility]FT[facility]G None  07/27/2023 10:10 AM Cheral Marker, CNM [facility]  07/30/2023 10:10 AM [facility] NURSE [facility]  08/03/2023  9:15 AM [facility] - FTOBGYN [place] [facility]-FTIMG None  08/03/2023 10:10 AM Myna Hidalgo, DO [facility]  08/06/2023 10:10 AM [facility] NURSE [facility]  08/10/2023  9:15 AM [facility] - FT IMG 2 [facility]FT[facility]G None  08/10/2023 10:10 AM Myna Hidalgo, DO [facility]  08/13/2023  9:50 AM [facility] NURSE [facility]  08/17/2023  9:15 AM [facility] - FT IMG 2 [facility]FTIMG None  08/17/2023 10:10 AM Cheral Marker, CNM [facility]  08/20/2023  9:30 AM [facility] NURSE [facility]    No orders of the defined types were placed in this encounter.  Cheral Marker CNM, [facility] 06/29/2023 11:40 AM

## 2023-06-30 ENCOUNTER — Other Ambulatory Visit: Payer: Self-pay

## 2023-06-30 DIAGNOSIS — O24419 Gestational diabetes mellitus in pregnancy, unspecified control: Secondary | ICD-10-CM

## 2023-07-01 ENCOUNTER — Other Ambulatory Visit: Payer: Medicaid Other | Admitting: Dietician

## 2023-07-01 DIAGNOSIS — O24419 Gestational diabetes mellitus in pregnancy, unspecified control: Secondary | ICD-10-CM

## 2023-07-01 DIAGNOSIS — Z3A32 32 weeks gestation of pregnancy: Secondary | ICD-10-CM | POA: Diagnosis not present

## 2023-07-01 DIAGNOSIS — Z8279 Family history of other congenital malformations, deformations and chromosomal abnormalities: Secondary | ICD-10-CM | POA: Diagnosis not present

## 2023-07-01 DIAGNOSIS — O35BXX1 Maternal care for other (suspected) fetal abnormality and damage, fetal cardiac anomalies, fetus 1: Secondary | ICD-10-CM | POA: Diagnosis not present

## 2023-07-02 ENCOUNTER — Other Ambulatory Visit: Payer: Self-pay | Admitting: Obstetrics & Gynecology

## 2023-07-02 ENCOUNTER — Ambulatory Visit: Payer: Medicaid Other | Admitting: *Deleted

## 2023-07-02 VITALS — BP 129/91 | HR 80

## 2023-07-02 DIAGNOSIS — O0990 Supervision of high risk pregnancy, unspecified, unspecified trimester: Secondary | ICD-10-CM

## 2023-07-02 DIAGNOSIS — O0993 Supervision of high risk pregnancy, unspecified, third trimester: Secondary | ICD-10-CM | POA: Diagnosis not present

## 2023-07-02 DIAGNOSIS — O24415 Gestational diabetes mellitus in pregnancy, controlled by oral hypoglycemic drugs: Secondary | ICD-10-CM

## 2023-07-02 DIAGNOSIS — O10913 Unspecified pre-existing hypertension complicating pregnancy, third trimester: Secondary | ICD-10-CM | POA: Diagnosis not present

## 2023-07-02 DIAGNOSIS — Z3A32 32 weeks gestation of pregnancy: Secondary | ICD-10-CM | POA: Diagnosis not present

## 2023-07-02 DIAGNOSIS — O10919 Unspecified pre-existing hypertension complicating pregnancy, unspecified trimester: Secondary | ICD-10-CM

## 2023-07-02 DIAGNOSIS — O133 Gestational [pregnancy-induced] hypertension without significant proteinuria, third trimester: Secondary | ICD-10-CM

## 2023-07-02 MED ORDER — NIFEDIPINE ER OSMOTIC RELEASE 30 MG PO TB24: 30.0000 mg | ORAL_TABLET | Freq: Every day | ORAL | 4 refills | Status: DC

## 2023-07-02 NOTE — Progress Notes (Signed)
   NURSE VISIT- NST  SUBJECTIVE:  Suzanne Mack is a 28 y.o. G53P2002 female at [redacted]w[redacted]d, here for a NST for pregnancy complicated by [facility] and Diabetes: A2DM}.  She reports active fetal movement, contractions: none, vaginal bleeding: none, membranes: intact.  OBJECTIVE:  BP (!) 129/91   Pulse 80   LMP 10/25/2022   Appears well, no apparent distress BP elevated today.  Pt not taking labetalol as "bp has been fine"  No results found for this or any previous visit (from the past 24 hours).  NST: FHR baseline 130 bpm, Variability: moderate, Accelerations:present, Decelerations:  Absent= Cat 1/reactive Toco: none   ASSESSMENT: G3P2002 at [redacted]w[redacted]d with CHTN and Diabetes: A2DM} NST reactive  PLAN: EFM strip reviewed by Dr. Charlotta Newton   Recommendations: keep next appointment as scheduled   Will send in Procardia  Winta Barcelo  07/02/2023 11:15 AM

## 2023-07-02 NOTE — Progress Notes (Signed)
Pt reports side effects with Labetalol Changing to procardia  Myna Hidalgo, DO Attending Obstetrician & Gynecologist, [facility] for [employer], [facility]

## 2023-07-06 ENCOUNTER — Ambulatory Visit: Payer: Medicaid Other | Admitting: Obstetrics & Gynecology

## 2023-07-06 ENCOUNTER — Encounter: Payer: Self-pay | Admitting: Obstetrics & Gynecology

## 2023-07-06 ENCOUNTER — Ambulatory Visit: Payer: Medicaid Other | Admitting: Radiology

## 2023-07-06 VITALS — BP 123/92 | HR 86 | Wt 181.0 lb

## 2023-07-06 DIAGNOSIS — O10913 Unspecified pre-existing hypertension complicating pregnancy, third trimester: Secondary | ICD-10-CM

## 2023-07-06 DIAGNOSIS — O0993 Supervision of high risk pregnancy, unspecified, third trimester: Secondary | ICD-10-CM

## 2023-07-06 DIAGNOSIS — O09293 Supervision of pregnancy with other poor reproductive or obstetric history, third trimester: Secondary | ICD-10-CM | POA: Diagnosis not present

## 2023-07-06 DIAGNOSIS — O24419 Gestational diabetes mellitus in pregnancy, unspecified control: Secondary | ICD-10-CM | POA: Diagnosis not present

## 2023-07-06 DIAGNOSIS — Z3A33 33 weeks gestation of pregnancy: Secondary | ICD-10-CM

## 2023-07-06 DIAGNOSIS — O10919 Unspecified pre-existing hypertension complicating pregnancy, unspecified trimester: Secondary | ICD-10-CM

## 2023-07-06 DIAGNOSIS — O09299 Supervision of pregnancy with other poor reproductive or obstetric history, unspecified trimester: Secondary | ICD-10-CM

## 2023-07-06 DIAGNOSIS — O24415 Gestational diabetes mellitus in pregnancy, controlled by oral hypoglycemic drugs: Secondary | ICD-10-CM | POA: Diagnosis not present

## 2023-07-06 DIAGNOSIS — O0990 Supervision of high risk pregnancy, unspecified, unspecified trimester: Secondary | ICD-10-CM

## 2023-07-06 NOTE — Progress Notes (Signed)
HIGH-RISK PREGNANCY VISIT Patient name: Suzanne Mack MRN [id number]  Date of birth: [birth year] Chief Complaint:   Routine Prenatal Visit  History of Present Illness:   Suzanne Mack is a 28 y.o. G46P2002 female at [redacted]w[redacted]d with an Estimated Date of Delivery: 08/23/23 being seen today for ongoing management of a high-risk pregnancy complicated by A2DM on metformin 500 mg, not checking her blood sugars at all and cHTN prescribed Procardia XL 30 mg , not taking because"she is scare" of how it will make her feel.     Today she reports no complaints. Contractions: Not present. Vag. Bleeding: None.  Movement: Present. denies leaking of fluid.      11/04/2021   10:08 AM 07/14/2021    2:32 PM 05/15/2021    1:32 PM  Depression screen PHQ 2/9  Decreased Interest 0 2 1  Down, Depressed, Hopeless 0 0 1  PHQ - 2 Score 0 2 2  Altered sleeping 0 0 0  Tired, decreased energy 2 1 1   Change in appetite 0 0 0  Feeling bad or failure about yourself  0 0 1  Trouble concentrating 0 0 0  Moving slowly or fidgety/restless 0 0 0  Suicidal thoughts 0 0 0  PHQ-9 Score 2 3 4         11/04/2021   10:08 AM 07/14/2021    2:33 PM 05/15/2021    1:32 PM  GAD 7 : Generalized Anxiety Score  Nervous, Anxious, on Edge 0 1 0  Control/stop worrying 0 0 0  Worry too much - different things 0 0 0  Trouble relaxing 0 1 0  Restless 0 0 0  Easily annoyed or irritable 0 2 2  Afraid - awful might happen 0 1 1  Total GAD 7 Score 0 5 3     Review of Systems:   Pertinent items are noted in HPI Denies abnormal vaginal discharge w/ itching/odor/irritation, headaches, visual changes, shortness of breath, chest pain, abdominal pain, severe nausea/vomiting, or problems with urination or bowel movements unless otherwise stated above. Pertinent History Reviewed:  Reviewed past medical,surgical, social, obstetrical and family history.  Reviewed problem list, medications and allergies. Physical Assessment:   Vitals:    07/06/23 0943  BP: (!) 123/92  Pulse: 86  Weight: 181 lb (82.1 kg)  Body mass index is 32.06 kg/m.           Physical Examination:   General appearance: alert, well appearing, and in no distress  Mental status: alert, oriented to person, place, and time  Skin: warm & dry   Extremities:      Cardiovascular: normal heart rate noted  Respiratory: normal respiratory effort, no distress  Abdomen: gravid, soft, non-tender  Pelvic: Cervical exam deferred         Fetal Status:     Movement: Present    Fetal Surveillance Testing today: BPP 8/8 with normal UAD   Chaperone: N/A    No results found for this or any previous visit (from the past 24 hours).  Assessment & Plan:  High-risk pregnancy: G3P2002 at [redacted]w[redacted]d with an Estimated Date of Delivery: 08/23/23      ICD-10-CM   1. Supervision of high risk pregnancy, antepartum  O09.90     2. Chronic hypertension: Prescribe Procardia xl 30, pt not taking  O10.919    explained importance of normalizing BP in pregnancy, pt concerned about how it may make her feel, I told her to try it and if it causes an issue,  let me know    3. Gestational diabetes mellitus A2 on metformin 500 qAM: not checking her CBGx at all  O24.415    trying to get her a CGM, she is not going to check her sugars        Meds: No orders of the defined types were placed in this encounter.   Orders: No orders of the defined types were placed in this encounter.    Labs/procedures today: U/S  Treatment Plan:  keep scheduled  Reviewed: Preterm labor symptoms and general obstetric precautions including but not limited to vaginal bleeding, contractions, leaking of fluid and fetal movement were reviewed in detail with the patient.  All questions were answered. Does have home bp cuff. Office bp cuff given: not applicable. Check bp daily, let us know if consistently >140 and/or >90.  Follow-up: No follow-ups on file.   Future Appointments  Date Time Provider Department  Center  07/09/2023  9:50 AM [facility] NURSE [facility]  07/13/2023  9:15 AM [facility] - FT IMG 2 [facility]FT[facility]G None  07/13/2023 10:10 AM Lazaro Arms, MD [facility]  07/16/2023 10:30 AM [facility] NURSE [facility]  07/20/2023  9:15 AM [facility] - FTOBGYN [place] [facility]G None  07/20/2023 10:10 AM Cheral Marker, CNM [facility]  07/23/2023 10:10 AM [facility] NURSE [facility]  07/27/2023  9:15 AM [facility] - FT IMG 2 [facility]-FT[facility]G None  07/27/2023 10:10 AM Cheral Marker, CNM [facility]  07/30/2023 10:10 AM [facility] NURSE [facility]  08/03/2023  9:15 AM [facility] - FTO[facility]YN [place] [facility] None  08/03/2023 10:10 AM Myna Hidalgo, DO [facility]  08/06/2023 10:10 AM [facility] NURSE [facility]  08/10/2023  9:15 AM [facility] - FT IMG 2 [facility]FT[facility]G None  08/10/2023 10:10 AM Myna Hidalgo, DO [facility]  08/13/2023  9:50 AM [facility] NURSE [facility]  08/17/2023  9:15 AM C[facility] - FT IMG 2 C[facility]-FTIMG None  08/17/2023 10:10 AM Cheral Marker, CNM [facility]  08/20/2023  9:30 AM [facility] NURSE [facility]    No orders of the defined types were placed in this encounter.  Lazaro Arms  Attending Physician for the Center for [facility] 07/06/2023 10:31 AM

## 2023-07-06 NOTE — Progress Notes (Signed)
[place]:  GA = 33+1 weeks Single active female fetus, cephalic, FHR = 159bpm  posterior pl high, central cord insertion with surface vessels seen along superior fundal aspect.  AFI = 11.7 cm,  24%  MVP = 3.4 cm BPP = 8/8   RI: 0.65, 0.70, 0.4  65%  good respirations seen

## 2023-07-07 MED ORDER — DEXCOM G6 SENSOR MISC: 6 refills | Status: AC

## 2023-07-07 MED ORDER — DEXCOM G6 TRANSMITTER MISC: 0 refills | Status: AC

## 2023-07-07 NOTE — Addendum Note (Signed)
Addended by: Moss Mc on: 07/07/2023 03:04 PM   Modules accepted: Orders

## 2023-07-09 ENCOUNTER — Ambulatory Visit: Payer: Medicaid Other | Admitting: *Deleted

## 2023-07-09 VITALS — BP 125/87 | HR 85 | Wt 180.0 lb

## 2023-07-09 DIAGNOSIS — O0990 Supervision of high risk pregnancy, unspecified, unspecified trimester: Secondary | ICD-10-CM

## 2023-07-09 DIAGNOSIS — Z3A33 33 weeks gestation of pregnancy: Secondary | ICD-10-CM

## 2023-07-09 DIAGNOSIS — O10919 Unspecified pre-existing hypertension complicating pregnancy, unspecified trimester: Secondary | ICD-10-CM

## 2023-07-09 DIAGNOSIS — O24415 Gestational diabetes mellitus in pregnancy, controlled by oral hypoglycemic drugs: Secondary | ICD-10-CM | POA: Diagnosis not present

## 2023-07-09 DIAGNOSIS — Z1389 Encounter for screening for other disorder: Secondary | ICD-10-CM

## 2023-07-09 DIAGNOSIS — Z331 Pregnant state, incidental: Secondary | ICD-10-CM

## 2023-07-09 LAB — POCT URINALYSIS DIPSTICK OB
Blood, UA: NEGATIVE
Glucose, UA: NEGATIVE
Ketones, UA: NEGATIVE
Leukocytes, UA: NEGATIVE
Nitrite, UA: NEGATIVE

## 2023-07-09 NOTE — Progress Notes (Signed)
   NURSE VISIT- NST  SUBJECTIVE:  Cilicia Borden is a 28 y.o. G14P2002 female at [redacted]w[redacted]d, here for a NST for pregnancy complicated by [facility] and Diabetes: A2DM}.  She reports active fetal movement, contractions: none, vaginal bleeding: none, membranes: intact.   OBJECTIVE:  BP 125/87   Pulse 85   Wt 180 lb (81.6 kg)   LMP 10/25/2022   BMI 31.89 kg/m   Appears well, no apparent distress  No results found for this or any previous visit (from the past 24 hours).  NST: FHR baseline 130 bpm, Variability: moderate, Accelerations:present, Decelerations:  Absent= Cat 1/reactive Toco: none   ASSESSMENT: G3P2002 at [redacted]w[redacted]d with CHTN and Diabetes: A2DM} NST reactive  PLAN: EFM strip reviewed by Dr. Despina Hidden   Recommendations: keep next appointment as scheduled    Jobe Marker  07/09/2023 10:49 AM

## 2023-07-09 NOTE — Addendum Note (Signed)
Addended by: Federico Flake A on: 07/09/2023 11:24 AM   Modules accepted: Orders

## 2023-07-13 ENCOUNTER — Encounter: Payer: Self-pay | Admitting: Obstetrics & Gynecology

## 2023-07-13 ENCOUNTER — Ambulatory Visit: Payer: Medicaid Other | Admitting: Radiology

## 2023-07-13 ENCOUNTER — Ambulatory Visit: Payer: Medicaid Other | Admitting: Obstetrics & Gynecology

## 2023-07-13 VITALS — BP 130/94 | HR 76 | Wt 183.0 lb

## 2023-07-13 DIAGNOSIS — O24419 Gestational diabetes mellitus in pregnancy, unspecified control: Secondary | ICD-10-CM | POA: Diagnosis not present

## 2023-07-13 DIAGNOSIS — O10913 Unspecified pre-existing hypertension complicating pregnancy, third trimester: Secondary | ICD-10-CM

## 2023-07-13 DIAGNOSIS — O09299 Supervision of pregnancy with other poor reproductive or obstetric history, unspecified trimester: Secondary | ICD-10-CM

## 2023-07-13 DIAGNOSIS — Z3A34 34 weeks gestation of pregnancy: Secondary | ICD-10-CM | POA: Diagnosis not present

## 2023-07-13 DIAGNOSIS — O10919 Unspecified pre-existing hypertension complicating pregnancy, unspecified trimester: Secondary | ICD-10-CM

## 2023-07-13 DIAGNOSIS — O09293 Supervision of pregnancy with other poor reproductive or obstetric history, third trimester: Secondary | ICD-10-CM | POA: Diagnosis not present

## 2023-07-13 DIAGNOSIS — O24415 Gestational diabetes mellitus in pregnancy, controlled by oral hypoglycemic drugs: Secondary | ICD-10-CM | POA: Diagnosis not present

## 2023-07-13 DIAGNOSIS — O0993 Supervision of high risk pregnancy, unspecified, third trimester: Secondary | ICD-10-CM | POA: Diagnosis not present

## 2023-07-13 DIAGNOSIS — O0990 Supervision of high risk pregnancy, unspecified, unspecified trimester: Secondary | ICD-10-CM

## 2023-07-13 NOTE — Progress Notes (Signed)
 HIGH-RISK PREGNANCY VISIT Patient name: Suzanne Mack MRN [id number]  Date of birth: 1995-12-16 Chief Complaint:   Routine Prenatal Visit  History of Present Illness:   Suzanne Mack is a 28 y.o. G74P2002 female at [redacted]w[redacted]d with an Estimated Date of Delivery: 08/23/23 being seen today for ongoing management of a high-risk pregnancy complicated by . A2DM on metformin 500 qAM and cHTN on procardia xl 30 qd   Non compliant checking sugars trying to get her a CGM  Today she reports . Contractions: Not present. Vag. Bleeding: None.  Movement: Present. denies leaking of fluid.      11/04/2021   10:08 AM 07/14/2021    2:32 PM 05/15/2021    1:32 PM  Depression screen PHQ 2/9  Decreased Interest 0 2 1  Down, Depressed, Hopeless 0 0 1  PHQ - 2 Score 0 2 2  Altered sleeping 0 0 0  Tired, decreased energy 2 1 1   Change in appetite 0 0 0  Feeling bad or failure about yourself  0 0 1  Trouble concentrating 0 0 0  Moving slowly or fidgety/restless 0 0 0  Suicidal thoughts 0 0 0  PHQ-9 Score 2 3 4         11/04/2021   10:08 AM 07/14/2021    2:33 PM 05/15/2021    1:32 PM  GAD 7 : Generalized Anxiety Score  Nervous, Anxious, on Edge 0 1 0  Control/stop worrying 0 0 0  Worry too much - different things 0 0 0  Trouble relaxing 0 1 0  Restless 0 0 0  Easily annoyed or irritable 0 2 2  Afraid - awful might happen 0 1 1  Total GAD 7 Score 0 5 3     Review of Systems:   Pertinent items are noted in HPI Denies abnormal vaginal discharge w/ itching/odor/irritation, headaches, visual changes, shortness of breath, chest pain, abdominal pain, severe nausea/vomiting, or problems with urination or bowel movements unless otherwise stated above. Pertinent History Reviewed:  Reviewed past medical,surgical, social, obstetrical and family history.  Reviewed problem list, medications and allergies. Physical Assessment:   Vitals:   07/13/23 0951  BP: (!) 130/94  Pulse: 76  Weight: 183 lb (83  kg)  Body mass index is 32.42 kg/m.           Physical Examination:   General appearance: alert, well appearing, and in no distress  Mental status: alert, oriented to person, place, and time  Skin: warm & dry   Extremities:      Cardiovascular: normal heart rate noted  Respiratory: normal respiratory effort, no distress  Abdomen: gravid, soft, non-tender  Pelvic: Cervical exam deferred         Fetal Status:     Movement: Present    Fetal Surveillance Testing today: BPP 8/8 with normal UAD   Chaperone:     No results found for this or any previous visit (from the past 24 hours).  Assessment & Plan:  High-risk pregnancy: G3P2002 at [redacted]w[redacted]d with an Estimated Date of Delivery: 08/23/23      ICD-10-CM   1. Supervision of high risk pregnancy, antepartum  O09.90     2. Gestational diabetes mellitus (GDM) in third trimester controlled on oral hypoglycemic drug  O24.415     3. Chronic hypertension affecting pregnancy  O10.919          Meds: No orders of the defined types were placed in this encounter.   Orders: No orders of the  defined types were placed in this encounter.    Labs/procedures today: U/S  Treatment Plan:  keep scheduled appts    Follow-up: Return for keep scheduled.   Future Appointments  Date Time Provider Department Center  07/16/2023 10:30 AM [facility] NURSE [facility]  07/20/2023  9:15 AM [facility] [place] [facility] None  07/20/2023 10:10 AM Cheral Marker, CNM [facility]  07/23/2023 10:10 AM [facility] NURSE [facility]  07/27/2023  9:15 AM [facility] IMG 2 [facility] None  07/27/2023 10:10 AM Cheral Marker, CNM [facility]  07/30/2023 10:10 AM [facility] NURSE [facility]  08/03/2023  9:15 AM [facility] [place] [facility] None  08/03/2023 10:10 AM Myna Hidalgo, DO [facility]  08/06/2023 10:10 AM [facility] NURSE [facility]  08/10/2023  9:15 AM [facility] IMG 2 [facility] None  08/10/2023 10:10 AM Myna Hidalgo, DO [facility]   08/13/2023  9:50 AM [facility] NURSE [facility]  08/17/2023  9:15 AM [facility] IMG 2 [facility] None  08/17/2023 10:10 AM Cheral Marker, CNM [facility]  08/20/2023  9:30 AM [facility] NURSE [facility]    No orders of the defined types were placed in this encounter.  Lazaro Arms  Attending Physician for the Center for [facility] 07/13/2023 10:31 AM

## 2023-07-13 NOTE — Progress Notes (Signed)
 [place]:   GA = 34+1 weeks   Single active female fetus,  cephalic, FHR = 143 bpm  posterior pl high, gr1, AFI = 13.7 cm,  MVP = 4.7 cm  BPP = 8/8,  RI: 0.65, 0.72, 0.76  69%

## 2023-07-16 ENCOUNTER — Ambulatory Visit: Payer: Medicaid Other | Admitting: *Deleted

## 2023-07-16 VITALS — BP 132/86 | HR 69

## 2023-07-16 DIAGNOSIS — O10913 Unspecified pre-existing hypertension complicating pregnancy, third trimester: Secondary | ICD-10-CM

## 2023-07-16 DIAGNOSIS — O10919 Unspecified pre-existing hypertension complicating pregnancy, unspecified trimester: Secondary | ICD-10-CM

## 2023-07-16 DIAGNOSIS — Z3A34 34 weeks gestation of pregnancy: Secondary | ICD-10-CM

## 2023-07-16 DIAGNOSIS — O24415 Gestational diabetes mellitus in pregnancy, controlled by oral hypoglycemic drugs: Secondary | ICD-10-CM

## 2023-07-16 DIAGNOSIS — O0993 Supervision of high risk pregnancy, unspecified, third trimester: Secondary | ICD-10-CM

## 2023-07-16 DIAGNOSIS — O0990 Supervision of high risk pregnancy, unspecified, unspecified trimester: Secondary | ICD-10-CM

## 2023-07-16 NOTE — Progress Notes (Signed)
   NURSE VISIT- NST  SUBJECTIVE:  Suzanne Mack is a 28 y.o. G60P2002 female at [redacted]w[redacted]d, here for a NST for pregnancy complicated by [facility] and A2DM.  She reports active fetal movement, contractions: none, vaginal bleeding: none, membranes: intact.   OBJECTIVE:  BP 132/86   Pulse 69   LMP 10/25/2022   Appears well, no apparent distress  No results found for this or any previous visit (from the past 24 hours).  NST: FHR baseline 135 bpm, Variability: moderate, Accelerations:present, Decelerations:  Absent= Cat 1/reactive Toco: 1UC noted   ASSESSMENT: G3P2002 at [redacted]w[redacted]d with CHTN and A1DM NST reactive  PLAN: EFM strip reviewed by Dr. Despina Hidden   Recommendations: keep next appointment as scheduled    Jobe Marker  07/16/2023 11:39 AM

## 2023-07-20 ENCOUNTER — Encounter (HOSPITAL_COMMUNITY): Payer: Self-pay

## 2023-07-20 ENCOUNTER — Encounter: Payer: Self-pay | Admitting: Women's Health

## 2023-07-20 ENCOUNTER — Encounter: Payer: Medicaid Other | Admitting: Obstetrics & Gynecology

## 2023-07-20 ENCOUNTER — Telehealth (HOSPITAL_COMMUNITY): Payer: Self-pay | Admitting: *Deleted

## 2023-07-20 ENCOUNTER — Other Ambulatory Visit: Payer: Medicaid Other | Admitting: Radiology

## 2023-07-20 ENCOUNTER — Ambulatory Visit (INDEPENDENT_AMBULATORY_CARE_PROVIDER_SITE_OTHER): Payer: Medicaid Other | Admitting: Women's Health

## 2023-07-20 ENCOUNTER — Ambulatory Visit: Payer: Medicaid Other

## 2023-07-20 VITALS — BP 126/87 | HR 99 | Wt 183.0 lb

## 2023-07-20 DIAGNOSIS — O09293 Supervision of pregnancy with other poor reproductive or obstetric history, third trimester: Secondary | ICD-10-CM

## 2023-07-20 DIAGNOSIS — O0993 Supervision of high risk pregnancy, unspecified, third trimester: Secondary | ICD-10-CM | POA: Diagnosis not present

## 2023-07-20 DIAGNOSIS — Z3A35 35 weeks gestation of pregnancy: Secondary | ICD-10-CM

## 2023-07-20 DIAGNOSIS — O24419 Gestational diabetes mellitus in pregnancy, unspecified control: Secondary | ICD-10-CM | POA: Diagnosis not present

## 2023-07-20 DIAGNOSIS — O10919 Unspecified pre-existing hypertension complicating pregnancy, unspecified trimester: Secondary | ICD-10-CM

## 2023-07-20 DIAGNOSIS — O09299 Supervision of pregnancy with other poor reproductive or obstetric history, unspecified trimester: Secondary | ICD-10-CM

## 2023-07-20 DIAGNOSIS — O24415 Gestational diabetes mellitus in pregnancy, controlled by oral hypoglycemic drugs: Secondary | ICD-10-CM

## 2023-07-20 DIAGNOSIS — O10913 Unspecified pre-existing hypertension complicating pregnancy, third trimester: Secondary | ICD-10-CM

## 2023-07-20 DIAGNOSIS — O0990 Supervision of high risk pregnancy, unspecified, unspecified trimester: Secondary | ICD-10-CM

## 2023-07-20 NOTE — Patient Instructions (Signed)
 Suzanne Mack, thank you for choosing our office today! We appreciate the opportunity to meet your healthcare needs. You may receive a short survey by mail, e-mail, or through [employer]. If you are happy with your care we would appreciate if you could take just a few minutes to complete the survey questions. We read all of your comments and take your feedback very seriously. Thank you again for choosing our office.  Center for [employer] Team at [facility]  [facility] at [facility] ([address] [place], [place] [address]) Entrance C, located off of [address] Free 24/7 valet parking   CLASSES: Go to [email or web address] to register for classes (childbirth, breastfeeding, waterbirth, infant CPR, daddy bootcamp, etc.)  Call the office [phone]) or go to [facility] if: You begin to have strong, frequent contractions Your water breaks.  Sometimes it is a big gush of fluid, sometimes it is just a trickle that keeps getting your panties wet or running down your legs You have vaginal bleeding.  It is normal to have a small amount of spotting if your cervix was checked.  You don't feel your baby moving like normal.  If you don't, get you something to eat and drink and lay down and focus on feeling your baby move.   If your baby is still not moving like normal, you should call the office or go to [facility].  Call the office [phone]) or go to [facility] hospital for these signs of pre-eclampsia: Severe headache that does not go away with Tylenol Visual changes- seeing spots, double, blurred vision Pain under your right breast or upper abdomen that does not go away with Tums or heartburn medicine Nausea and/or vomiting Severe swelling in your hands, feet, and face   Tdap Vaccine It is recommended that you get the Tdap vaccine during the third trimester of EACH pregnancy to help protect your baby from getting pertussis (whooping cough) 27-36 weeks is the BEST time to do  this so that you can pass the protection on to your baby. During pregnancy is better than after pregnancy, but if you are unable to get it during pregnancy it will be offered at the hospital.  You can get this vaccine with [place], at the health department, your family doctor, or some local pharmacies Everyone who will be around your baby should also be up-to-date on their vaccines before the baby comes. Adults (who are not pregnant) only need 1 dose of Tdap during adulthood.   [facility] Pediatricians/Family Doctors [facility]): [address] Colette Ribas, [phone]           [facility]: [address], [phone]                [facility]): [address], [phone] (call to ask if accepting patients) [facility] Department: [address], [place], [phone]    [facility] Pediatricians/Family Doctors [facility]): [phone] [address], [phone] [facility]: [address] [place], [phone] [facility]: [address], [phone]  [facility] Doctors  [facility]): [phone] [facility]: [address] [phone]   [facility] Doctors [facility]: [address], [phone]  [facility] Family Doctors  [facility]: [phone], [phone]  Home Blood Pressure Monitoring for Patients   Your provider has recommended that you check your  blood pressure (BP) at least once a week at home. If you do not have a blood pressure cuff at home, one will be provided for you. Contact your provider if you have not received your monitor within 1 week.   Helpful Tips for Accurate Home Blood Pressure Checks  Don't smoke, exercise, or drink caffeine 30 minutes before checking your BP Use the restroom before checking your BP (a full bladder can raise your  pressure) Relax in a comfortable upright chair Feet on the ground Left arm resting comfortably on a flat surface at the level of your heart Legs uncrossed Back supported Sit quietly and don't talk Place the cuff on your bare arm Adjust snuggly, so that only two fingertips can fit between your skin and the top of the cuff Check 2 readings separated by at least one minute Keep a log of your BP readings For a visual, please reference this diagram: http://ccnc.care/bpdiagram  Provider Name: [facility]     Phone: [phone]  Zone 1: ALL CLEAR  Continue to monitor your symptoms:  BP reading is less than 140 (top number) or less than 90 (bottom number)  No right upper stomach pain No headaches or seeing spots No feeling nauseated or throwing up No swelling in face and hands  Zone 2: CAUTION Call your doctor's office for any of the following:  BP reading is greater than 140 (top number) or greater than 90 (bottom number)  Stomach pain under your ribs in the middle or right side Headaches or seeing spots Feeling nauseated or throwing up Swelling in face and hands  Zone 3: EMERGENCY  Seek immediate medical care if you have any of the following:  BP reading is greater than160 (top number) or greater than 110 (bottom number) Severe headaches not improving with Tylenol Serious difficulty catching your breath Any worsening symptoms from Zone 2  Preterm Labor and Birth Information  The normal length of a pregnancy is 39-41 weeks. Preterm labor is when labor starts before 37 completed weeks of pregnancy. What are the risk factors for preterm labor? Preterm labor is more likely to occur in women who: Have certain infections during pregnancy such as a bladder infection, sexually transmitted infection, or infection inside the uterus (chorioamnionitis). Have a shorter-than-normal cervix. Have gone into preterm labor before. Have had surgery on their cervix. Are younger than age 68  or older than age 60. Are African American. Are pregnant with twins or multiple babies (multiple gestation). Take street drugs or smoke while pregnant. Do not gain enough weight while pregnant. Became pregnant shortly after having been pregnant. What are the symptoms of preterm labor? Symptoms of preterm labor include: Cramps similar to those that can happen during a menstrual period. The cramps may happen with diarrhea. Pain in the abdomen or lower back. Regular uterine contractions that may feel like tightening of the abdomen. A feeling of increased pressure in the pelvis. Increased watery or bloody mucus discharge from the vagina. Water breaking (ruptured amniotic sac). Why is it important to recognize signs of preterm labor? It is important to recognize signs of preterm labor because babies who are born prematurely may not be fully developed. This can put them at an increased risk for: Long-term (chronic) heart and lung problems. Difficulty immediately after birth with regulating body systems, including blood sugar, body temperature, heart rate, and breathing rate. Bleeding in the brain. Cerebral palsy. Learning difficulties. Death. These risks are highest for babies who are born before 34 weeks  of pregnancy. How is preterm labor treated? Treatment depends on the length of your pregnancy, your condition, and the health of your baby. It may involve: Having a stitch (suture) placed in your cervix to prevent your cervix from opening too early (cerclage). Taking or being given medicines, such as: Hormone medicines. These may be given early in pregnancy to help support the pregnancy. Medicine to stop contractions. Medicines to help mature the baby's lungs. These may be prescribed if the risk of delivery is high. Medicines to prevent your baby from developing cerebral palsy. If the labor happens before 34 weeks of pregnancy, you may need to stay in the hospital. What should I do if I  think I am in preterm labor? If you think that you are going into preterm labor, call your health care provider right away. How can I prevent preterm labor in future pregnancies? To increase your chance of having a full-term pregnancy: Do not use any tobacco products, such as cigarettes, chewing tobacco, and e-cigarettes. If you need help quitting, ask your health care provider. Do not use street drugs or medicines that have not been prescribed to you during your pregnancy. Talk with your health care provider before taking any herbal supplements, even if you have been taking them regularly. Make sure you gain a healthy amount of weight during your pregnancy. Watch for infection. If you think that you might have an infection, get it checked right away. Make sure to tell your health care provider if you have gone into preterm labor before. This information is not intended to replace advice given to you by your health care provider. Make sure you discuss any questions you have with your health care provider. Document Revised: 08/26/2018 Document Reviewed: 09/25/2015 Elsevier Patient Education  2020 [employer].

## 2023-07-20 NOTE — Progress Notes (Addendum)
 [place] 35+1 wks,cephalic,BPP 8/8,FHR 148 bpm,AFI 11 cm,fundal placenta gr 3,RI .69,.69,.72=92%,EFW 2374 g 23%,FL 2%,bilateral renal pelvic dilatation RK 9 mm,LK 7 mm

## 2023-07-20 NOTE — Telephone Encounter (Signed)
 Preadmission screen

## 2023-07-20 NOTE — Progress Notes (Signed)
 HIGH-RISK PREGNANCY VISIT Patient name: Suzanne Mack MRN [id number]  Date of birth: 12/05/1995 Chief Complaint:   Routine Prenatal Visit  History of Present Illness:   Suzanne Mack is a 28 y.o. G55P2002 female at [redacted]w[redacted]d with an Estimated Date of Delivery: 08/23/23 being seen today for ongoing management of a high-risk pregnancy complicated by chronic hypertension currently on nifedipine 30mg  and diabetes mellitus A2DM currently on metformin 500mg  AM, borderline UAD 92% today .    Today she reports  not checking sugars, just got Dexcom and hasn't set it up yet (didn't like pricking her fingers) . Contractions: Not present. Vag. Bleeding: None.  Movement: Present. denies leaking of fluid.      11/04/2021   10:08 AM 07/14/2021    2:32 PM 05/15/2021    1:32 PM  Depression screen PHQ 2/9  Decreased Interest 0 2 1  Down, Depressed, Hopeless 0 0 1  PHQ - 2 Score 0 2 2  Altered sleeping 0 0 0  Tired, decreased energy 2 1 1   Change in appetite 0 0 0  Feeling bad or failure about yourself  0 0 1  Trouble concentrating 0 0 0  Moving slowly or fidgety/restless 0 0 0  Suicidal thoughts 0 0 0  PHQ-9 Score 2 3 4         11/04/2021   10:08 AM 07/14/2021    2:33 PM 05/15/2021    1:32 PM  GAD 7 : Generalized Anxiety Score  Nervous, Anxious, on Edge 0 1 0  Control/stop worrying 0 0 0  Worry too much - different things 0 0 0  Trouble relaxing 0 1 0  Restless 0 0 0  Easily annoyed or irritable 0 2 2  Afraid - awful might happen 0 1 1  Total GAD 7 Score 0 5 3     Review of Systems:   Pertinent items are noted in HPI Denies abnormal vaginal discharge w/ itching/odor/irritation, headaches, visual changes, shortness of breath, chest pain, abdominal pain, severe nausea/vomiting, or problems with urination or bowel movements unless otherwise stated above. Pertinent History Reviewed:  Reviewed past medical,surgical, social, obstetrical and family history.  Reviewed problem list,  medications and allergies. Physical Assessment:   Vitals:   07/20/23 0957  BP: 126/87  Pulse: 99  Weight: 183 lb (83 kg)  Body mass index is 32.42 kg/m.           Physical Examination:   General appearance: alert, well appearing, and in no distress  Mental status: alert, oriented to person, place, and time  Skin: warm & dry   Extremities: Edema: None    Cardiovascular: normal heart rate noted  Respiratory: normal respiratory effort, no distress  Abdomen: gravid, soft, non-tender  Pelvic: Cervical exam deferred         Fetal Status:     Movement: Present    Fetal Surveillance Testing today:  [place] 35+1 wks,cephalic,BPP 8/8,FHR 148 bpm,AFI 11 cm,fundal placenta gr 3,RI .69,.69,.72=92%,EFW 2374 g 23%,FL 2%,bilateral renal pelvic dilatation RK 9 mm,LK 7 mm   Chaperone: N/A  No results found for this or any previous visit (from the past 24 hours).  Assessment & Plan:  High-risk pregnancy: G3P2002 at [redacted]w[redacted]d with an Estimated Date of Delivery: 08/23/23   1) CHTN, stable on nifedipine 30mg , ASA. Reviewed pre-e s/s, reasons to seek care  2) A2DM, not checking sugars (didn't like pricking fingers), just picked up Dexcom- hasn't opened box/set up yet. To do this today, if any issues let us  know. Continue metformin 500mg  qam for now. Today's EFW 23%  3) Borderline elevated UAD> 92% today  4) Fetal bilateral RPD> new finding, Rt 8m, Lt 7mm, discussed and gave printed info, peds f/u pp  Meds: No orders of the defined types were placed in this encounter.   Labs/procedures today: U/S  Treatment Plan:  EFW q 4w    2x/wk testing nst/sono     Deliver 37w (d/t CHTN on meds, borderline UAD, noncompliance w/ checking sugars), IOL scheduled for 3/17 @ [facility],  IOL form faxed and orders placed   Reviewed: Preterm labor symptoms and general obstetric precautions including but not limited to vaginal bleeding, contractions, leaking of fluid and fetal movement were reviewed in detail with the patient.  All  questions were answered. Does have home bp cuff. Office bp cuff given: yes. Check bp daily, let us know if consistently >140 and/or >90.  Follow-up: Return for As scheduled.   Future Appointments  Date Time Provider Department Center  07/23/2023 10:10 AM [facility] NURSE [facility]  07/27/2023  9:15 AM [facility] IMG 2 [facility] None  07/27/2023 10:10 AM Cheral Marker, CNM [facility]  07/30/2023 10:10 AM [facility] NURSE [facility]  08/02/2023 12:00 AM [facility] SCHED ROOM [facility] None  08/03/2023  9:15 AM [facility] [place] [facility] None  08/03/2023 10:10 AM Myna Hidalgo, DO [facility]  08/06/2023 10:10 AM [facility] NURSE [facility]  08/10/2023  9:15 AM [facility] FT IMG 2 [facility] None  08/10/2023 10:10 AM Myna Hidalgo, DO [facility]  08/13/2023  9:50 AM [facility] NURSE [facility]  08/17/2023  9:15 AM [facility] IMG 2 [facility] None  08/17/2023 10:10 AM Cheral Marker, CNM [facility]  08/20/2023  9:30 AM [facility] NURSE [facility]    No orders of the defined types were placed in this encounter.  Cheral Marker CNM, [facility] 07/20/2023 10:35 AM

## 2023-07-23 ENCOUNTER — Ambulatory Visit: Payer: Medicaid Other | Admitting: *Deleted

## 2023-07-23 VITALS — BP 146/93 | HR 112 | Wt 185.4 lb

## 2023-07-23 DIAGNOSIS — O24415 Gestational diabetes mellitus in pregnancy, controlled by oral hypoglycemic drugs: Secondary | ICD-10-CM

## 2023-07-23 DIAGNOSIS — O0993 Supervision of high risk pregnancy, unspecified, third trimester: Secondary | ICD-10-CM | POA: Diagnosis not present

## 2023-07-23 DIAGNOSIS — O10913 Unspecified pre-existing hypertension complicating pregnancy, third trimester: Secondary | ICD-10-CM

## 2023-07-23 DIAGNOSIS — O0990 Supervision of high risk pregnancy, unspecified, unspecified trimester: Secondary | ICD-10-CM

## 2023-07-23 DIAGNOSIS — Z3A35 35 weeks gestation of pregnancy: Secondary | ICD-10-CM

## 2023-07-23 DIAGNOSIS — O10919 Unspecified pre-existing hypertension complicating pregnancy, unspecified trimester: Secondary | ICD-10-CM

## 2023-07-23 NOTE — Progress Notes (Signed)
   NURSE VISIT- NST  SUBJECTIVE:  Suzanne Mack is a 28 y.o. G71P2002 female at [redacted]w[redacted]d, here for a NST for pregnancy complicated by [facility] and Diabetes: A2DM}.  She reports active fetal movement, contractions: none, vaginal bleeding: none, membranes: intact.   OBJECTIVE:  BP (!) 146/93   Pulse (!) 112   Wt 185 lb 6.4 oz (84.1 kg)   LMP 10/25/2022   BMI 32.84 kg/m   Appears well, no apparent distress  No results found for this or any previous visit (from the past 24 hours).  NST: FHR baseline 145 bpm, Variability: moderate, Accelerations:present, Decelerations:  Absent= Cat 1/reactive Toco: none   ASSESSMENT: G3P2002 at [redacted]w[redacted]d with CHTN and Diabetes: A2DM} NST reactive  PLAN: EFM strip reviewed by Dr. Charlotta Newton   Recommendations: keep next appointment as scheduled    Debbe Odea Viana Sleep  07/23/2023 11:00 AM

## 2023-07-25 DIAGNOSIS — Z3A00 Weeks of gestation of pregnancy not specified: Secondary | ICD-10-CM | POA: Diagnosis not present

## 2023-07-25 DIAGNOSIS — O4593 Premature separation of placenta, unspecified, third trimester: Secondary | ICD-10-CM | POA: Diagnosis not present

## 2023-07-25 DIAGNOSIS — O134 Gestational [pregnancy-induced] hypertension without significant proteinuria, complicating childbirth: Secondary | ICD-10-CM | POA: Diagnosis not present

## 2023-07-27 ENCOUNTER — Encounter: Payer: Medicaid Other | Admitting: Women's Health

## 2023-07-27 ENCOUNTER — Other Ambulatory Visit: Payer: Medicaid Other | Admitting: Radiology

## 2023-07-30 ENCOUNTER — Other Ambulatory Visit: Payer: Medicaid Other

## 2023-08-02 ENCOUNTER — Inpatient Hospital Stay (HOSPITAL_COMMUNITY)

## 2023-08-02 ENCOUNTER — Inpatient Hospital Stay (HOSPITAL_COMMUNITY): Admission: RE | Admit: 2023-08-02 | Source: Home / Self Care | Admitting: Family Medicine

## 2023-08-03 ENCOUNTER — Other Ambulatory Visit: Payer: Medicaid Other

## 2023-08-03 ENCOUNTER — Encounter: Payer: Medicaid Other | Admitting: Obstetrics & Gynecology

## 2023-08-03 ENCOUNTER — Encounter: Payer: Medicaid Other | Admitting: Women's Health

## 2023-08-06 ENCOUNTER — Other Ambulatory Visit: Payer: Medicaid Other

## 2023-08-10 ENCOUNTER — Other Ambulatory Visit: Payer: Medicaid Other | Admitting: Radiology

## 2023-08-10 ENCOUNTER — Encounter: Payer: Medicaid Other | Admitting: Obstetrics & Gynecology

## 2023-08-13 ENCOUNTER — Other Ambulatory Visit: Payer: Medicaid Other

## 2023-08-17 ENCOUNTER — Other Ambulatory Visit: Payer: Medicaid Other | Admitting: Radiology

## 2023-08-17 ENCOUNTER — Encounter: Payer: Medicaid Other | Admitting: Women's Health

## 2023-08-20 ENCOUNTER — Other Ambulatory Visit: Payer: Medicaid Other

## 2023-08-31 ENCOUNTER — Encounter: Payer: Self-pay | Admitting: Women's Health

## 2023-08-31 ENCOUNTER — Ambulatory Visit: Admitting: Women's Health

## 2023-08-31 DIAGNOSIS — I10 Essential (primary) hypertension: Secondary | ICD-10-CM | POA: Diagnosis not present

## 2023-08-31 DIAGNOSIS — Z8632 Personal history of gestational diabetes: Secondary | ICD-10-CM

## 2023-08-31 DIAGNOSIS — Z8759 Personal history of other complications of pregnancy, childbirth and the puerperium: Secondary | ICD-10-CM | POA: Insufficient documentation

## 2023-08-31 DIAGNOSIS — Z3009 Encounter for other general counseling and advice on contraception: Secondary | ICD-10-CM

## 2023-08-31 NOTE — Patient Instructions (Addendum)
You will have your sugar test next visit.  Please do not eat or drink anything after midnight the night before you come, not even water.  You will be here for at least two hours.  Please make an appointment online for the bloodwork at [email or web address] for 8:30am (or as close to this as possible). Make sure you select the [facility] service center. The day of the appointment, check in with our office first, then you will go to [facility] to start the sugar test.    Tips To Increase Milk Supply Lots of water! Enough so that your urine is clear Plenty of calories, if you're not getting enough calories, your milk supply can decrease Breastfeed/pump often, every 2-3 hours x 20-32mins Fenugreek 3 pills 3 times a day, this may make your urine smell like maple syrup Mother's Milk Tea Lactation cookies, google for the recipe Real oatmeal Body Armor sports drinks Liquid Gold Greater Than hydration drink

## 2023-08-31 NOTE — Progress Notes (Signed)
 POSTPARTUM VISIT Patient name: Suzanne Mack MRN [id number]  Date of birth: 1995/12/07 Chief Complaint:   Postpartum Care  History of Present Illness:   Suzanne Mack is a 28 y.o. G2P2103 Caucasian female being seen today for a postpartum visit. She is 5 weeks postpartum following a spontaneous vaginal delivery at 35.6 gestational weeks at [facility] d/t waking up in puddle of blood, had abruption w/ hemorrhage, bp 173/104 and 10/100/-1 on admit. Initial pre-e labs wnl (didn't do P:C ratio), admit hgb 11.7, dropped to 8.8, repeat 9.2. Plt 174 on admit, dropped to 114 then 122 on repeat. IOL: no, for n/a. Anesthesia: none.  Laceration: none.  Complications: hemorrhage 1L (d/t abruption). Inpatient contraception: no.   Pregnancy complicated by [facility] dx @ 12wks, no meds prior to pregnancy-was on nifedipine 30mg  daily during pregnancy. A2DM . Tobacco use: no. Substance use disorder: no. Last pap smear: 05/15/21 and results were NILM w/ HRHPV negative. Next pap smear due: Dec 2025 Patient's last menstrual period was 10/25/2022.  Postpartum course has been complicated by HTN, d/c'd on nifedipine 30mg  daily- pt states she never started . Bleeding none. Bowel function is normal. Bladder function is normal. Urinary incontinence? no, fecal incontinence? no Patient is not sexually active. Last sexual activity: prior to birth of baby. Desired contraception: condoms . Patient does not want a pregnancy in the future.  Desired family size is 3 children.   Upstream - 08/31/23 0908       Pregnancy Intention Screening   Does the patient want to become pregnant in the next year? No    Does the patient's partner want to become pregnant in the next year? No    Would the patient like to discuss contraceptive options today? No      Contraception Wrap Up   Current Method Abstinence    End Method Abstinence    Contraception Counseling Provided No            The pregnancy intention screening data noted  above was reviewed. Potential methods of contraception were discussed. The patient elected to proceed with Abstinence.  Edinburgh Postpartum Depression Screening: negative  Edinburgh Postnatal Depression Scale - 08/31/23 0910       Edinburgh Postnatal Depression Scale:  In the Past 7 Days   I have been able to laugh and see the funny side of things. 0    I have looked forward with enjoyment to things. 0    I have blamed myself unnecessarily when things went wrong. 0    I have been anxious or worried for no good reason. 0    I have felt scared or panicky for no good reason. 0    Things have been getting on top of me. 0    I have been so unhappy that I have had difficulty sleeping. 0    I have felt sad or miserable. 0    I have been so unhappy that I have been crying. 0    The thought of harming myself has occurred to me. 0    Edinburgh Postnatal Depression Scale Total 0                11/04/2021   10:08 AM 07/14/2021    2:33 PM 05/15/2021    1:32 PM  GAD 7 : Generalized Anxiety Score  Nervous, Anxious, on Edge 0 1 0  Control/stop worrying 0 0 0  Worry too much - different things 0 0 0  Trouble relaxing 0 1 0  Restless 0 0 0  Easily annoyed or irritable 0 2 2  Afraid - awful might happen 0 1 1  Total GAD 7 Score 0 5 3     Baby's course has been uncomplicated. Baby is feeding by breast and bottle: milk supply adequate. Infant has a pediatrician/family doctor? Yes.  Childcare strategy if returning to work/school: family.  Pt has material needs met for her and baby: Yes.   Review of Systems:   Pertinent items are noted in HPI Denies Abnormal vaginal discharge w/ itching/odor/irritation, headaches, visual changes, shortness of breath, chest pain, abdominal pain, severe nausea/vomiting, or problems with urination or bowel movements. Pertinent History Reviewed:  Reviewed past medical,surgical, obstetrical and family history.  Reviewed problem list, medications and allergies. OB  History  Gravida Para Term Preterm AB Living  3 3 2 1  0 3  SAB IAB Ectopic Multiple Live Births  0 0 0 0 3    # Outcome Date GA Lbr Len/2nd Weight Sex Type Anes PTL Lv  3 Preterm 07/25/23 [redacted]w[redacted]d  5 lb 4.3 oz (2.39 kg) M Vag-Spont   LIV     Birth Comments: abruption w/ hemorrhage, UNCR  2 Term 01/09/22 [redacted]w[redacted]d 05:25 / 00:10 6 lb 4.9 oz (2.86 kg) M Vag-Spont None N LIV     Complications: Gestational diabetes, Gestational hypertension  1 Term 12/02/17 [redacted]w[redacted]d  6 lb 11 oz (3.033 kg) F Vag-Spont EPI N LIV     Complications: Gestational diabetes   Physical Assessment:   Vitals:   08/31/23 0905 08/31/23 0918  BP: (!) 154/112 (!) 152/105  Pulse: 76 70  Weight: 170 lb (77.1 kg)   Height: 5\' 1"  (1.549 m)   Body mass index is 32.12 kg/m.       Physical Examination:   General appearance: alert, well appearing, and in no distress  Mental status: alert, oriented to person, place, and time  Skin: warm & dry   Cardiovascular: normal heart rate noted   Respiratory: normal respiratory effort, no distress   Breasts: deferred, no complaints   Abdomen: soft, non-tender   Pelvic: examination not indicated. Thin prep pap obtained: No  Rectal: not examined  Extremities: Edema: none   Chaperone: N/A       No results found for this or any previous visit (from the past 24 hours).  Assessment & Plan:  1) Postpartum exam 2) 5 wks s/p spontaneous vaginal delivery @ 35.6 d/t placental abruption/labor, ? severe pre-e 3) breast & bottle feeding 4) Depression screening 5) Contraception counseling> plans condoms 6) CHTN dx @ 12wks, currently uncontrolled> not on meds prior to pregnancy, was supposed to resume nifedipine 30mg  daily on d/c, never started. To start today (still has rx at home), f/u 2d for bp check w/ nurse (take nifedipine at least 2hr before appt). Has PCP 7) A2DM during pregnancy> schedule 2hr GTT  Essential components of care per ACOG recommendations:  1.  Mood and well being:  If  positive depression screen, discussed and plan developed.  If using tobacco we discussed reduction/cessation and risk of relapse If current substance abuse, we discussed and referral to local resources was offered.   2. Infant care and feeding:  If breastfeeding, discussed returning to work, pumping, breastfeeding-associated pain, guidance regarding return to fertility while lactating if not using another method. If needed, patient was provided with a letter to be allowed to pump q 2-3hrs to support lactation in a private location with access to a refrigerator to store breastmilk.  Recommended that all caregivers be immunized for flu, pertussis and other preventable communicable diseases If pt does not have material needs met for her/baby, referred to local resources for help obtaining these.  3. Sexuality, contraception and birth spacing Provided guidance regarding sexuality, management of dyspareunia, and resumption of intercourse Discussed avoiding interpregnancy interval <65mths and recommended birth spacing of 18 months  4. Sleep and fatigue Discussed coping options for fatigue and sleep disruption Encouraged family/partner/community support of 4 hrs of uninterrupted sleep to help with mood and fatigue  5. Physical recovery  If pt had a C/S, assessed incisional pain and providing guidance on normal vs prolonged recovery If pt had a laceration, perineal healing and pain reviewed.  If urinary or fecal incontinence, discussed management and referred to PT or uro/gyn if indicated  Patient is safe to resume physical activity. Discussed attainment of healthy weight.  6.  Chronic disease management Discussed pregnancy complications if any, and their implications for future childbearing and long-term maternal health. Review recommendations for prevention of recurrent pregnancy complications, such as 17 hydroxyprogesterone caproate to reduce risk for recurrent PTB: did not discuss, or aspirin to  reduce risk of preeclampsia: doesn't want anymore children. Pt had GDM: yes. If yes, 2hr GTT scheduled: yes. Reviewed medications and non-pregnant dosing including consideration of whether pt is breastfeeding using a reliable resource such as LactMed: yes Referred for f/u w/ PCP or subspecialist providers as indicated: yes  7. Health maintenance Mammogram at 29yo or earlier if indicated Pap smears as indicated  Meds: No orders of the defined types were placed in this encounter.   Follow-up: Return for thurs bp check w/ nurse in person and sugar test; then Dec for pap & physical.   No orders of the defined types were placed in this encounter.   Ferd Householder CNM, [facility] 08/31/2023 9:44 AM

## 2023-09-02 ENCOUNTER — Other Ambulatory Visit

## 2023-09-02 ENCOUNTER — Ambulatory Visit: Admitting: *Deleted

## 2023-09-02 VITALS — BP 140/103 | HR 60

## 2023-09-02 DIAGNOSIS — I10 Essential (primary) hypertension: Secondary | ICD-10-CM

## 2023-09-02 DIAGNOSIS — O133 Gestational [pregnancy-induced] hypertension without significant proteinuria, third trimester: Secondary | ICD-10-CM

## 2023-09-02 DIAGNOSIS — Z8632 Personal history of gestational diabetes: Secondary | ICD-10-CM

## 2023-09-02 MED ORDER — NIFEDIPINE ER 60 MG PO TB24: 60.0000 mg | ORAL_TABLET | Freq: Every day | ORAL | 4 refills | Status: AC

## 2023-09-02 NOTE — Progress Notes (Signed)
   NURSE VISIT- BLOOD PRESSURE CHECK  SUBJECTIVE:  Joannah Wuebker is a 28 y.o. [phone] female here for BP check. She delivered on 07/25/23  HYPERTENSION ROS:  Postpartum:  Severe headaches that don't go away with tylenol/other medicines: No  Visual changes (seeing spots/double/blurred vision) No  Severe pain under right breast breast or in center of upper chest No  Severe nausea/vomiting No  Taking medicines as instructed yes   OBJECTIVE:  BP (!) 140/103 (BP Location: Right Arm, Patient Position: Sitting, Cuff Size: Normal)   Pulse 60   Breastfeeding Yes   Appearance alert, well appearing, and in no distress.  ASSESSMENT: Postpartum  blood pressure check  PLAN: Discussed with Benny Braver, CNM, [facility]   Recommendations:  increase dose to 60mg  daily    Follow-up:  Monday for BP check with nurse    Laverne Potter  09/02/2023 10:42 AM

## 2023-09-02 NOTE — Addendum Note (Signed)
 Addended by: Ferd Householder on: 09/02/2023 10:49 AM   Modules accepted: Orders

## 2023-09-03 ENCOUNTER — Encounter: Payer: Self-pay | Admitting: Obstetrics & Gynecology

## 2023-09-03 LAB — GLUCOSE TOLERANCE, 2 HOURS W/ 1HR
Glucose, 1 hour: 241 mg/dL — ABNORMAL HIGH (ref 70–179)
Glucose, 2 hour: 222 mg/dL — ABNORMAL HIGH (ref 70–152)
Glucose, Fasting: 103 mg/dL — ABNORMAL HIGH (ref 70–91)

## 2023-09-06 ENCOUNTER — Encounter: Payer: Self-pay | Admitting: *Deleted

## 2023-09-06 ENCOUNTER — Ambulatory Visit

## 2023-09-06 VITALS — BP 118/81 | HR 89 | Ht 61.0 in | Wt 170.0 lb

## 2023-09-06 DIAGNOSIS — Z0130 Encounter for examination of blood pressure without abnormal findings: Secondary | ICD-10-CM

## 2023-09-06 NOTE — Progress Notes (Signed)
   NURSE VISIT- BLOOD PRESSURE CHECK  SUBJECTIVE:  Suzanne Mack is a 28 y.o. [phone] female here for BP check. She is postpartum, delivery date 07/25/23     HYPERTENSION ROS:  Pregnant/postpartum:  Severe headaches that don't go away with tylenol /other medicines: No  Visual changes (seeing spots/double/blurred vision) No  Severe pain under right breast breast or in center of upper chest No  Severe nausea/vomiting No  Taking medicines as instructed yes    OBJECTIVE:  BP 118/81 (BP Location: Left Arm, Patient Position: Sitting, Cuff Size: Normal)   Pulse 89   Ht 5\' 1"  (1.549 m)   Wt 170 lb (77.1 kg)   Breastfeeding Yes   BMI 32.12 kg/m   Appearance alert, well appearing, and in no distress.  ASSESSMENT: Postpartum  blood pressure check  PLAN: Discussed with Dr. Ozan   Recommendations: no changes needed   Follow-up:  for annual; follow up with PCP for diabetes and BP.     Alphonso Aschoff  09/06/2023 9:49 AM

## 2024-06-02 ENCOUNTER — Other Ambulatory Visit: Payer: Self-pay | Admitting: Adult Health

## 2024-06-02 ENCOUNTER — Ambulatory Visit (INDEPENDENT_AMBULATORY_CARE_PROVIDER_SITE_OTHER)

## 2024-06-02 VITALS — BP 144/95 | Ht 62.0 in | Wt 179.0 lb

## 2024-06-02 DIAGNOSIS — Z3201 Encounter for pregnancy test, result positive: Secondary | ICD-10-CM | POA: Diagnosis not present

## 2024-06-02 DIAGNOSIS — Z789 Other specified health status: Secondary | ICD-10-CM

## 2024-06-02 LAB — POCT URINE PREGNANCY: Preg Test, Ur: POSITIVE — AB

## 2024-06-02 MED ORDER — ONDANSETRON 4 MG PO TBDP: 4.0000 mg | ORAL_TABLET | Freq: Three times a day (TID) | ORAL | 1 refills | Status: AC | PRN

## 2024-06-02 NOTE — Progress Notes (Signed)
 Rx zofran

## 2024-06-02 NOTE — Progress Notes (Signed)
" ° °  NURSE VISIT- PREGNANCY CONFIRMATION   SUBJECTIVE:  Suzanne Mack is a 29 y.o. [phone] female at Unknown by uncertain LMP of No LMP recorded (lmp unknown). Patient is pregnant. Here for pregnancy confirmation.  Home pregnancy test: positive x 2  She reports nausea.  She is not taking prenatal vitamins.    OBJECTIVE:  BP (!) 144/95 (Cuff Size: Large)   Ht 5' 2 (1.575 m)   Wt 179 lb (81.2 kg)   LMP  (LMP Unknown)   Breastfeeding No   BMI 32.74 kg/m   Appears well, in no apparent distress  Results for orders placed or performed in visit on 06/02/24 (from the past 24 hours)  POCT urine pregnancy   Collection Time: 06/02/24 11:08 AM  Result Value Ref Range   Preg Test, Ur Positive (A) Negative    ASSESSMENT: Positive pregnancy test, Unknown by LMP    PLAN: Schedule for dating ultrasound once HCG level comes back Prenatal vitamins: plans to begin OTC ASAP   Nausea medicines: requested-note routed to [facility] to send prescription   OB packet given: Yes Follow up in 2 weeks for BP check  Aleck FORBES Blase  06/02/2024 11:11 AM  "

## 2024-06-03 LAB — BETA HCG QUANT (REF LAB): hCG Quant: 40559 m[IU]/mL

## 2024-06-05 ENCOUNTER — Ambulatory Visit: Payer: Self-pay | Admitting: Adult Health

## 2024-06-16 ENCOUNTER — Ambulatory Visit (INDEPENDENT_AMBULATORY_CARE_PROVIDER_SITE_OTHER): Admitting: *Deleted

## 2024-06-16 DIAGNOSIS — Z0130 Encounter for examination of blood pressure without abnormal findings: Secondary | ICD-10-CM

## 2024-06-16 NOTE — Progress Notes (Signed)
" ° °  NURSE VISIT- BLOOD PRESSURE CHECK  SUBJECTIVE:  Suzanne Mack is a 29 y.o. [phone] female here for BP check. She is Unknown pregnant    HYPERTENSION ROS:  Pregnant/postpartum:  Severe headaches that don't go away with tylenol /other medicines: No  Visual changes (seeing spots/double/blurred vision) No  Severe pain under right breast breast or in center of upper chest No  Severe nausea/vomiting No  Taking medicines as instructed not applicable    OBJECTIVE:  BP 133/85 (BP Location: Right Arm, Patient Position: Sitting, Cuff Size: Normal)   Pulse 84   LMP  (LMP Unknown)   Appearance alert, well appearing, and in no distress.  ASSESSMENT: Pregnancy Unknown  blood pressure check  PLAN: Discussed with Delon Lewis, NP Recommendations: no changes needed   Follow-up: schedule dating ultrasound   Alan LITTIE Fischer  06/16/2024 11:21 AM  "

## 2024-06-20 ENCOUNTER — Other Ambulatory Visit: Payer: Self-pay | Admitting: Obstetrics & Gynecology

## 2024-06-20 DIAGNOSIS — Z363 Encounter for antenatal screening for malformations: Secondary | ICD-10-CM

## 2024-06-21 ENCOUNTER — Other Ambulatory Visit

## 2024-06-21 DIAGNOSIS — Z363 Encounter for antenatal screening for malformations: Secondary | ICD-10-CM

## 2024-06-21 NOTE — Progress Notes (Signed)
 US  10+6 wks,single IUP,CRL 39.26 mm,FHR 175 bpm,normal ovaries

## 2024-07-05 ENCOUNTER — Encounter: Admitting: Advanced Practice Midwife

## 2024-07-05 ENCOUNTER — Encounter: Admitting: *Deleted
